# Patient Record
Sex: Female | Born: 1955 | Race: White | Hispanic: No | Smoking: Never smoker
Health system: Southern US, Community
[De-identification: ages and names within clinical notes are randomized; demographics above are authoritative.]

## PROBLEM LIST (undated history)

## (undated) HISTORY — PX: APPENDECTOMY: SHX54

---

## 2005-09-02 ENCOUNTER — Other Ambulatory Visit: Admission: RE | Admit: 2005-09-02 | Discharge: 2005-09-02 | Payer: Self-pay | Admitting: Obstetrics and Gynecology

## 2006-02-28 ENCOUNTER — Ambulatory Visit (HOSPITAL_COMMUNITY): Admission: RE | Admit: 2006-02-28 | Discharge: 2006-02-28 | Payer: Self-pay | Admitting: Obstetrics and Gynecology

## 2007-09-14 ENCOUNTER — Ambulatory Visit (HOSPITAL_COMMUNITY): Admission: RE | Admit: 2007-09-14 | Discharge: 2007-09-14 | Payer: Self-pay | Admitting: Obstetrics and Gynecology

## 2009-09-13 ENCOUNTER — Ambulatory Visit: Payer: Self-pay | Admitting: Sports Medicine

## 2009-09-13 DIAGNOSIS — M79609 Pain in unspecified limb: Secondary | ICD-10-CM | POA: Insufficient documentation

## 2009-10-18 ENCOUNTER — Ambulatory Visit: Payer: Self-pay | Admitting: Sports Medicine

## 2009-10-18 DIAGNOSIS — R269 Unspecified abnormalities of gait and mobility: Secondary | ICD-10-CM | POA: Insufficient documentation

## 2009-10-18 DIAGNOSIS — M722 Plantar fascial fibromatosis: Secondary | ICD-10-CM | POA: Insufficient documentation

## 2010-03-16 ENCOUNTER — Other Ambulatory Visit: Admission: RE | Admit: 2010-03-16 | Discharge: 2010-03-16 | Payer: Self-pay | Admitting: Obstetrics and Gynecology

## 2010-03-16 ENCOUNTER — Ambulatory Visit (HOSPITAL_COMMUNITY): Admission: RE | Admit: 2010-03-16 | Discharge: 2010-03-16 | Payer: Self-pay | Admitting: Internal Medicine

## 2010-04-06 ENCOUNTER — Ambulatory Visit (HOSPITAL_COMMUNITY): Admission: RE | Admit: 2010-04-06 | Discharge: 2010-04-06 | Payer: Self-pay | Admitting: Obstetrics and Gynecology

## 2010-10-09 ENCOUNTER — Ambulatory Visit: Payer: Self-pay | Admitting: Sports Medicine

## 2010-10-09 DIAGNOSIS — S92309A Fracture of unspecified metatarsal bone(s), unspecified foot, initial encounter for closed fracture: Secondary | ICD-10-CM | POA: Insufficient documentation

## 2010-11-06 ENCOUNTER — Ambulatory Visit: Payer: Self-pay | Admitting: Sports Medicine

## 2010-11-06 DIAGNOSIS — M7750 Other enthesopathy of unspecified foot: Secondary | ICD-10-CM | POA: Insufficient documentation

## 2011-01-06 ENCOUNTER — Encounter: Payer: Self-pay | Admitting: Obstetrics and Gynecology

## 2011-01-14 ENCOUNTER — Other Ambulatory Visit: Payer: Self-pay | Admitting: Gastroenterology
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
## 2011-02-05 ENCOUNTER — Ambulatory Visit (INDEPENDENT_AMBULATORY_CARE_PROVIDER_SITE_OTHER): Payer: BC Managed Care – PPO | Admitting: Sports Medicine

## 2011-02-05 ENCOUNTER — Encounter: Payer: Self-pay | Admitting: Sports Medicine

## 2011-02-05 DIAGNOSIS — M2020 Hallux rigidus, unspecified foot: Secondary | ICD-10-CM | POA: Insufficient documentation

## 2011-02-05 DIAGNOSIS — S92309A Fracture of unspecified metatarsal bone(s), unspecified foot, initial encounter for closed fracture: Secondary | ICD-10-CM

## 2011-02-05 DIAGNOSIS — M7750 Other enthesopathy of unspecified foot: Secondary | ICD-10-CM

## 2011-02-05 DIAGNOSIS — M79609 Pain in unspecified limb: Secondary | ICD-10-CM
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
## 2011-02-21 ENCOUNTER — Other Ambulatory Visit: Payer: Self-pay | Admitting: Gastroenterology

## 2011-02-21 DIAGNOSIS — K529 Noninfective gastroenteritis and colitis, unspecified: Secondary | ICD-10-CM

## 2012-03-02 ENCOUNTER — Ambulatory Visit (INDEPENDENT_AMBULATORY_CARE_PROVIDER_SITE_OTHER): Payer: BC Managed Care – PPO | Admitting: Sports Medicine

## 2012-03-02 VITALS — BP 108/60

## 2012-03-02 DIAGNOSIS — M2020 Hallux rigidus, unspecified foot: Secondary | ICD-10-CM

## 2012-03-02 DIAGNOSIS — M25519 Pain in unspecified shoulder: Secondary | ICD-10-CM

## 2012-03-02 DIAGNOSIS — M7750 Other enthesopathy of unspecified foot: Secondary | ICD-10-CM

## 2012-03-02 DIAGNOSIS — M722 Plantar fascial fibromatosis: Secondary | ICD-10-CM

## 2012-03-02 DIAGNOSIS — M25511 Pain in right shoulder: Secondary | ICD-10-CM

## 2012-03-02 MED ORDER — TRAMADOL HCL 50 MG PO TABS: 50.0000 mg | ORAL_TABLET | Freq: Four times a day (QID) | ORAL | Status: DC | PRN

## 2012-03-02 NOTE — Assessment & Plan Note (Signed)
I think the injury to the left great toe with a small avulsion fracture triggered some DJD There is a pseudo-bursa over medial 1st MTP joint left that is very tender  Continue to try to use MT support and good insole support to take pressure off feet  I encouraged using tramadol as she has had some GI bleeding with NSAIDs in past

## 2012-03-02 NOTE — Assessment & Plan Note (Signed)
This is resolved with orthotics and padding

## 2012-03-02 NOTE — Progress Notes (Signed)
  Subjective:    Patient ID: Wendy Morse, female    DOB: 1956-03-01, 56 y.o.   MRN: [id number]  HPI  Pt presents to clinic for f/u lt foot pain which is still bothering her. She is using custom orthotics in MBT shoes. She has added additional padding to heel. She has significant forefoot pain.   Rt shoulder pain x 1 month.  Pain is anterior and posterior. She does regular weight work. Had been using arms when on elliptical.  Stopped elliptcial a month ago because she thought this might have been causing shoulder pain. No specific injury. She did feel weight work hurt and so stopped this as well      Review of Systems     Objective:   Physical Exam NAD  Lt foot exam: Great toe- significant hallus rigidus 30 deg extension, 15 deg flexion- lt great toe 4th MT callus- fat pad minimal  2nd MT slightly dropped  Rt foot: Great toe 40 deg flexion, 50 deg extension great toe Loss of transverse arch, but some padding under all MT heads  Rt shoulder; ER and IR strength excellent Speed's and yergason's neg abduction at 20 degrees Feel some discomfort at 30 and 90 deg, but not weak Neer's positive Empty can positive  Hawkins positive  Push off negative  Neck ROM is good  [place] RT foot shows pseud-obursal change over medial joint line of MTP Small avulsion fracture never fully fused No increase in doppler flow Some increase fluid in MTP joint and over dorsum  Shoulder [place] BT intact - distally there is some slight subluxation near pec tendon with position of tendon shifted medially Supraspinatus, infraspinatus, subscapularis, teres minor - all tendons in tact Slt increase in bursal fluid AC joint OK No bony impingement      Assessment & Plan:

## 2012-03-02 NOTE — Patient Instructions (Addendum)
Please do suggested shoulder exercises daily, start with 1 lb weight and increase to 3lb weight when 1lb is easy  Take tramadol for foot or shoulder pain as needed  Please see [place] in 4-6 weeks if you have not improved  You may want to try [facility] shoes with added forefoot cushion  Thank you for seeing [place] today!

## 2012-03-02 NOTE — Assessment & Plan Note (Signed)
Given a series of rehab exercises to focus on RC but also on the scapular area Do these consistetnly We should reck this in 6 weeks or so  If it worsens would try NTG patches

## 2012-03-02 NOTE — Assessment & Plan Note (Signed)
Added more MT padding to other shoes  She simply has limited fat pad She needs to cont with good support in all shoes

## 2012-03-12 ENCOUNTER — Telehealth: Payer: Self-pay | Admitting: *Deleted

## 2012-03-12 NOTE — Telephone Encounter (Signed)
Message copied by Mora Bellman on Thu Mar 12, 2012  2:27 PM ------      Message from: CERESI, MELANIE L      Created: Thu Mar 12, 2012 11:21 AM      Regarding: pt referral       Pt would like to be referred to a physical therapist for her neck pain.  She states its urgent and wants an appt today, I let her know I don't think that would happen as we don't have any control of PT appts.

## 2012-03-12 NOTE — Telephone Encounter (Signed)
Called pt- left VM to return my call re: which PT office she would like to be referred to.

## 2012-10-01 ENCOUNTER — Other Ambulatory Visit: Payer: Self-pay | Admitting: Nurse Practitioner

## 2012-10-01 ENCOUNTER — Other Ambulatory Visit (HOSPITAL_COMMUNITY)
Admission: RE | Admit: 2012-10-01 | Discharge: 2012-10-01 | Disposition: A | Discharge status: Home / Self Care | Payer: BC Managed Care – PPO | Source: Ambulatory Visit | Attending: Obstetrics and Gynecology | Admitting: Obstetrics and Gynecology

## 2012-10-01 DIAGNOSIS — N760 Acute vaginitis: Secondary | ICD-10-CM | POA: Insufficient documentation

## 2012-10-01 DIAGNOSIS — Z01419 Encounter for gynecological examination (general) (routine) without abnormal findings: Secondary | ICD-10-CM | POA: Insufficient documentation

## 2012-10-02 ENCOUNTER — Other Ambulatory Visit (HOSPITAL_COMMUNITY): Payer: Self-pay | Admitting: Obstetrics and Gynecology

## 2012-10-02 ENCOUNTER — Ambulatory Visit (INDEPENDENT_AMBULATORY_CARE_PROVIDER_SITE_OTHER): Payer: BC Managed Care – PPO | Admitting: Sports Medicine

## 2012-10-02 VITALS — BP 90/60 | Ht 66.0 in | Wt 129.0 lb

## 2012-10-02 DIAGNOSIS — S62523A Displaced fracture of distal phalanx of unspecified thumb, initial encounter for closed fracture: Secondary | ICD-10-CM

## 2012-10-02 DIAGNOSIS — S62639A Displaced fracture of distal phalanx of unspecified finger, initial encounter for closed fracture: Secondary | ICD-10-CM

## 2012-10-02 DIAGNOSIS — M67929 Unspecified disorder of synovium and tendon, unspecified upper arm: Secondary | ICD-10-CM

## 2012-10-02 DIAGNOSIS — M7520 Bicipital tendinitis, unspecified shoulder: Secondary | ICD-10-CM

## 2012-10-02 DIAGNOSIS — M2020 Hallux rigidus, unspecified foot: Secondary | ICD-10-CM

## 2012-10-02 DIAGNOSIS — Z1231 Encounter for screening mammogram for malignant neoplasm of breast: Secondary | ICD-10-CM

## 2012-10-02 MED ORDER — TRAMADOL HCL 50 MG PO TABS: 50.0000 mg | ORAL_TABLET | Freq: Four times a day (QID) | ORAL | Status: DC | PRN

## 2012-10-02 MED ORDER — DICLOFENAC SODIUM 1 % TD GEL: 2.0000 g | Freq: Four times a day (QID) | TRANSDERMAL | Status: AC
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
## 2012-10-20 ENCOUNTER — Ambulatory Visit (HOSPITAL_COMMUNITY)
Admission: RE | Admit: 2012-10-20 | Discharge: 2012-10-20 | Disposition: A | Discharge status: Home / Self Care | Payer: BC Managed Care – PPO | Source: Ambulatory Visit | Attending: Obstetrics and Gynecology | Admitting: Obstetrics and Gynecology

## 2012-10-20 DIAGNOSIS — Z1231 Encounter for screening mammogram for malignant neoplasm of breast: Secondary | ICD-10-CM

## 2013-07-28 ENCOUNTER — Ambulatory Visit (INDEPENDENT_AMBULATORY_CARE_PROVIDER_SITE_OTHER): Payer: BC Managed Care – PPO | Admitting: Sports Medicine

## 2013-07-28 VITALS — BP 100/66 | Ht 66.0 in | Wt 130.0 lb

## 2013-07-28 DIAGNOSIS — M533 Sacrococcygeal disorders, not elsewhere classified: Secondary | ICD-10-CM

## 2013-07-28 DIAGNOSIS — S92309A Fracture of unspecified metatarsal bone(s), unspecified foot, initial encounter for closed fracture: Secondary | ICD-10-CM

## 2013-07-28 MED ORDER — TRAMADOL HCL 50 MG PO TABS
50.0000 mg | ORAL_TABLET | Freq: Four times a day (QID) | ORAL | Status: DC | PRN
Start: 2013-07-28 — End: 2013-08-06
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
## 2013-08-06 ENCOUNTER — Other Ambulatory Visit: Payer: Self-pay | Admitting: *Deleted

## 2013-08-06 MED ORDER — TRAMADOL HCL 50 MG PO TABS: 50.0000 mg | ORAL_TABLET | Freq: Four times a day (QID) | ORAL | Status: DC | PRN

## 2013-08-17 ENCOUNTER — Other Ambulatory Visit: Payer: Self-pay | Admitting: *Deleted

## 2013-08-17 ENCOUNTER — Telehealth: Payer: Self-pay | Admitting: *Deleted

## 2013-08-17 MED ORDER — METHOCARBAMOL 750 MG PO TABS: 750.0000 mg | ORAL_TABLET | Freq: Two times a day (BID) | ORAL | Status: DC | PRN

## 2013-08-17 MED ORDER — CYCLOBENZAPRINE HCL 10 MG PO TABS: ORAL_TABLET | ORAL | Status: DC

## 2013-08-17 NOTE — Telephone Encounter (Addendum)
Message copied by Mora Bellman on Tue Aug 17, 2013  2:40 PM ------      Message from: Lizbeth Bark      Created: Tue Aug 17, 2013  1:32 PM      Regarding: phone message      Contact: [phone]       Pt called asking if she can get a muscle relaxer for her back pain.      Pharmacy is [facility] [phone] ------

## 2013-08-17 NOTE — Telephone Encounter (Signed)
Per pt she did not want flexeril - makes her too drowsy.  Called in robaxin per Dr. Margaretha Sheffield. Pt notified.

## 2013-08-24 ENCOUNTER — Telehealth: Payer: Self-pay | Admitting: Sports Medicine

## 2013-08-24 NOTE — Telephone Encounter (Signed)
Message copied by Ralene Cork on Tue Aug 24, 2013  4:47 PM ------      Message from: Lizbeth Bark      Created: Tue Aug 24, 2013  3:03 PM      Regarding: PHONE MESSAGE      Contact: [phone]       Pt would like a call from regarding a f/u conversation. ------

## 2013-08-24 NOTE — Telephone Encounter (Signed)
I spoke with the patient on the phone today. She continues to experience low back pain and is now getting radiating pain down the leg. She is also getting some associated numbness and tingling. She denies any weakness. She has been diligent about doing her home exercises. At this point I think it is prudent to proceed with an MRI scan of her lumbar spine to rule out any possible disc pathology that may be responsible for her symptoms. We will set up the MRI to be done sometime early next week and I will followup with her via telephone with those results. In the meantime I've asked that she start taking Aleve 1-2 pills twice daily. She is cautioned about GI upset. She can take these safely with her muscle relaxer. I will likely want the patient to return to the office at some point after the MRI so that I may correlate her symptoms with any suspicious findings on the MRI. I also explained to the patient that we can use a short course of prednisone if her symptoms warrant.

## 2013-08-25 ENCOUNTER — Telehealth: Payer: Self-pay | Admitting: *Deleted

## 2013-08-25 DIAGNOSIS — M545 Low back pain, unspecified: Secondary | ICD-10-CM

## 2013-08-25 NOTE — Telephone Encounter (Signed)
Message copied by Mora Bellman on Wed Aug 25, 2013  9:01 AM ------      Message from: Ralene Cork      Created: Tue Aug 24, 2013  4:50 PM      Regarding: FW: PHONE MESSAGE      Contact: [phone]       Spoke with the patient. Note dictated. Please schedule an MRI of her lumbar spine to rule out a lumbar disc herniation. She would like to have this done next Tuesday afternoon if possible.            ----- Message -----         From: Lizbeth Bark         Sent: 08/24/2013   3:03 PM           To: Ralene Cork, DO      Subject: PHONE MESSAGE                                            Pt would like a call from regarding a f/u conversation.       ------

## 2013-08-25 NOTE — Telephone Encounter (Signed)
Scheduled pt for MRI at [facility] [address] on 08/31/13 @ 6:30 pm.  Pt notified of appt info.

## 2013-08-31 ENCOUNTER — Ambulatory Visit
Admission: RE | Admit: 2013-08-31 | Discharge: 2013-08-31 | Disposition: A | Discharge status: Home / Self Care | Payer: BC Managed Care – PPO | Source: Ambulatory Visit | Attending: Sports Medicine | Admitting: Sports Medicine

## 2013-08-31 DIAGNOSIS — M545 Low back pain, unspecified: Secondary | ICD-10-CM

## 2013-09-03 ENCOUNTER — Telehealth: Payer: Self-pay | Admitting: Sports Medicine

## 2013-09-03 NOTE — Telephone Encounter (Signed)
I spoke with the patient on the phone today regarding MRI findings of her lumbar spine. Dominant finding is facet arthropathy bilaterally at L4-L5. She also has a small bulging disc at L5-S1. Over the past week her pain has improved. She is avoiding both biking and rowing and as a consequence her pain is improving. My device at this point in time is for her to continue to avoid those activities for another 2-3 weeks. I would like for her to catch up with Jeanene Erb for some physical therapy. Since her symptoms are improving I am going to hold on any further treatment. I explained to her that we can get more aggressive in the form of facet or epidural steroid injections if her symptoms warrant. She will followup when necessary.

## 2013-09-29 ENCOUNTER — Ambulatory Visit: Payer: BC Managed Care – PPO | Admitting: Emergency Medicine

## 2013-12-03 ENCOUNTER — Ambulatory Visit
Admission: RE | Admit: 2013-12-03 | Discharge: 2013-12-03 | Disposition: A | Discharge status: Home / Self Care | Payer: BC Managed Care – PPO | Source: Ambulatory Visit | Attending: Internal Medicine | Admitting: Internal Medicine

## 2013-12-03 ENCOUNTER — Other Ambulatory Visit: Payer: Self-pay | Admitting: Internal Medicine

## 2013-12-03 DIAGNOSIS — R05 Cough: Secondary | ICD-10-CM

## 2013-12-03 DIAGNOSIS — R059 Cough, unspecified: Secondary | ICD-10-CM

## 2014-05-13 ENCOUNTER — Ambulatory Visit (INDEPENDENT_AMBULATORY_CARE_PROVIDER_SITE_OTHER): Payer: BC Managed Care – PPO | Admitting: Sports Medicine

## 2014-05-13 ENCOUNTER — Encounter: Payer: Self-pay | Admitting: Sports Medicine

## 2014-05-13 VITALS — BP 96/62 | Ht 66.0 in | Wt 128.0 lb

## 2014-05-13 DIAGNOSIS — M545 Low back pain, unspecified: Secondary | ICD-10-CM

## 2014-05-13 MED ORDER — METHOCARBAMOL 750 MG PO TABS: 750.0000 mg | ORAL_TABLET | Freq: Two times a day (BID) | ORAL | Status: DC | PRN

## 2014-05-13 MED ORDER — TRAMADOL HCL 50 MG PO TABS: 50.0000 mg | ORAL_TABLET | Freq: Four times a day (QID) | ORAL | Status: DC | PRN

## 2014-05-13 NOTE — Progress Notes (Signed)
   Subjective:    Patient ID: Wendy Morse, female    DOB: January 10, 1956, 58 y.o.   MRN: [id number]  HPI chief complaint: Low back pain and left leg pain  Patient comes in today complaining of 2-4 weeks of returning left-sided low back pain. She has a history of low back pain which in the past has been diagnosed as SI joint dysfunction. She still has intermittent left-sided low back pain but over the past couple of weeks has developed more in the way of cramping and numbness down the left leg. Discomfort will travel into the left ankle. Tends to be worse with prolonged sitting such as driving. She's also getting some left groin pain. No trauma. She's not noticed any weakness. She does get symptom relief with taking tramadol along with a combination ibuprofen/codeine medication which she got in [place]. An MRI of her lumbar spine done in September of 2014 showed prominent facet arthropathy at L4-L5 along with some mild degenerative disc disease at L5-S1 but there was no significant spinal or foraminal stenosis seen (only very mild spinal stenosis at L4-L5). She has also noticed that she tends to wear the left heel of her shoes out much more than the right heel.  Interim medical history reviewed Current medications reviewed Allergies reviewed    Review of Systems     Objective:   Physical Exam Well-developed, well-nourished. No acute distress. Awake alert and oriented x3. Vital signs reviewed.  Lumbar spine: Full lumbar range of motion. No tenderness to palpation over the lumbar midline. No tenderness at the SI joint. No muscle spasm. Negative straight leg raise bilaterally. Negative log roll bilaterally. Neurological exam does show 4/5 strength with resisted plantarflexion on the left compared to the right. However, she is able to ambulate on her tiptoes without any noticeable weakness. Remainder of her strength is 5/5 in both lower extremities. No atrophy. Reflexes are trace but equal at the  Achilles and patellar tendons bilaterally. No noticeable leg length discrepancy. Normal gait.  MRI is as above       Assessment & Plan:  Low back pain and left leg radiculopathy likely secondary to mild L5-S1 degenerative disc disease  Although the MRI done in 2014 does not show any definitive foraminal stenosis, her history and physical are suggestive of lumbar radiculopathy. I've given her a set of McKenzie exercises and asked her to avoid any type of repetitive lumbar flexion. I've refilled both her Robaxin and her tramadol to take as needed. She can increase activity as tolerated. If symptoms worsen, particularly in regards to weakness, I've asked the patient to notify me at which point we may have to consider repeat imaging. Otherwise, followup when necessary.

## 2014-07-01 ENCOUNTER — Encounter: Payer: Self-pay | Admitting: Sports Medicine

## 2014-07-01 ENCOUNTER — Ambulatory Visit
Admission: RE | Admit: 2014-07-01 | Discharge: 2014-07-01 | Disposition: A | Discharge status: Home / Self Care | Payer: BC Managed Care – PPO | Source: Ambulatory Visit | Attending: Sports Medicine | Admitting: Sports Medicine

## 2014-07-01 ENCOUNTER — Ambulatory Visit (INDEPENDENT_AMBULATORY_CARE_PROVIDER_SITE_OTHER): Payer: BC Managed Care – PPO | Admitting: Sports Medicine

## 2014-07-01 VITALS — BP 114/73 | HR 56 | Ht 66.0 in | Wt 127.0 lb

## 2014-07-01 DIAGNOSIS — M25532 Pain in left wrist: Secondary | ICD-10-CM

## 2014-07-01 DIAGNOSIS — R269 Unspecified abnormalities of gait and mobility: Secondary | ICD-10-CM

## 2014-07-01 DIAGNOSIS — M25539 Pain in unspecified wrist: Secondary | ICD-10-CM

## 2014-07-01 NOTE — Progress Notes (Signed)
   Subjective:    Patient ID: Wendy Morse, female    DOB: 01/28/1956, 58 y.o.   MRN: [id number]  HPI Patient comes in at my request to be evaluated by both myself and Dr. Darrick PennaFields. At her last office visit she showed me shoes with an unusual wear pattern. There is wear along the lateral aspect of her shoes consistent with supination but also an excessive amount of wear in the left heel. I asked her to return today so that Dr.Fields could also evaluate her. She has a history of lumbar arthropathy with left leg radiculopathy (please see last office note for details regarding this). She is also complaining of some pain in the volar aspect of her left wrist. She fell about 10 weeks ago and has had persistent pain just distal to the scaphoid tubercle. She has a previous injury to this left wrist which was concerning for a possible scaphoid fracture. She recovered from that injury without any complication. She is concerned that she may have reinjured her scaphoid. She has not noticed any limited range of motion. Only pain with direct pressure over the area.   Review of Systems     Objective:   Physical Exam Well-developed, well-nourished. No acute distress. Awake alert and oriented x3. Vital signs reviewed  Left wrist: Full range of motion. No effusion. No soft tissue swelling. There is tenderness to palpation along the volar aspect of the wrist just distal to the scaphoid tubercle. No tenderness in the anatomic snuff box. No tenderness over the distal radius. Neurovascularly intact distally.  Examination of her feet in the standing position shows collapse of the lateral column breakdown bilaterally, right greater than left. She has accompanying claw toe deformity of the second through fourth toes. Callus buildup along the metatarsal heads with collapse of the transverse arch. She does not supinate with walking. In fact, there is a very mild amount of pronation present.  X-rays of the left wrist  including AP, lateral, scaphoid, and carpal tunnel views are unremarkable. No obvious fracture .       Assessment & Plan:  Lateral column breakdown, bilateral feet Left wrist contusion  Reassurance regarding her left wrist. We gave her some green sports insoles with metatarsal pads and a lateral post. She has had custom orthotics made in the past and she will drop them off at our office at her convenience so that we may can post the laterally also. Followup formally when necessary.

## 2014-09-27 ENCOUNTER — Encounter: Payer: Self-pay | Admitting: Sports Medicine

## 2014-09-27 ENCOUNTER — Ambulatory Visit (INDEPENDENT_AMBULATORY_CARE_PROVIDER_SITE_OTHER): Payer: BC Managed Care – PPO | Admitting: Sports Medicine

## 2014-09-27 VITALS — BP 122/76 | HR 56 | Ht 66.0 in | Wt 127.0 lb

## 2014-09-27 DIAGNOSIS — M25562 Pain in left knee: Secondary | ICD-10-CM | POA: Diagnosis not present

## 2014-09-27 MED ORDER — TRAMADOL HCL 50 MG PO TABS: 50.0000 mg | ORAL_TABLET | Freq: Four times a day (QID) | ORAL | Status: DC | PRN

## 2014-09-27 NOTE — Progress Notes (Signed)
   Subjective:    Patient ID: Wendy Morse, female    DOB: 12/08/1956, 58 y.o.   MRN: [id number]  HPI Patient comes in today with worsening left leg pain and weakness. She has a history of sciatica. She complains of a diffuse pain in the posterior left hip with radiating discomfort down the left leg past the knee and into the foot. She gets some associated numbness and tingling as well. Also some weakness. She denies any groin pain. An MRI of her lumbar spine done in September of 2014 showed some degenerative changes at the L4-L5 facets and a shallow disc protrusion at L5-S1. Otherwise unremarkable. She has been compliant with her home exercises including McKenzie exercises.    Review of Systems     Objective:   Physical Exam Well-developed, fit-appearing. No acute distress  Negative straight leg raise bilaterally. She is tender to palpation of the left greater trochanteric bursa but not markedly. She has significant hip abductor weakness bilaterally. Negative log roll bilaterally. Mild amount of weakness with resisted plantarflexion on the left compared to the right. Otherwise strength is 5/5 both lower extremities. Reflexes are trace but equal at the Achilles and patellar tendons. Sensation is intact to light touch grossly. No atrophy.       Assessment & Plan:  Left leg pain likely secondary to lumbar radiculopathy Left hip pain secondary to mild greater trochanteric bursitis  Given her worsening symptoms I recommend an EMG/nerve conduction study. I will call her with those results once available. I have refilled her tramadol. She's instructed in hip abductor strengthening exercises. Of note, she brought in her other orthotics today for us to fit him with lateral posts. We had fitted a previous pair of green sports insoles with the same posts.

## 2014-09-27 NOTE — Patient Instructions (Signed)
Dr Marcy SirenIbazebo's office will be giving you a call with your appt for the nerve conduction study: [phone]

## 2014-10-26 ENCOUNTER — Telehealth: Payer: Self-pay | Admitting: Sports Medicine

## 2014-10-26 ENCOUNTER — Other Ambulatory Visit: Payer: Self-pay | Admitting: *Deleted

## 2014-10-26 DIAGNOSIS — M5442 Lumbago with sciatica, left side: Secondary | ICD-10-CM

## 2014-10-26 NOTE — Telephone Encounter (Signed)
I spoke with patient on the phone yesterday after reviewing a recent EMG/nerve conduction study of her left lower extremity. That study shows evidence of chronic lumbar radiculopathy affecting the left L4 or L5 or S1 levels. It was recommended by the physiatrist performing the test that an updated MRI of her lumbar spine be performed to correlate these findings with any advancing lumbar degenerative disc disease. It was also recommended that an MRI of her pelvis be done as the patient was complaining of some swelling in the area of her SI joint. Therefore, I will go ahead and order an MRI of her lumbar spine and pelvis and will follow-up with her on the phone once I reviewed those studies.

## 2014-10-27 ENCOUNTER — Encounter: Payer: Self-pay | Admitting: Sports Medicine

## 2014-11-05 ENCOUNTER — Other Ambulatory Visit: Payer: BC Managed Care – PPO

## 2014-11-06 ENCOUNTER — Ambulatory Visit
Admission: RE | Admit: 2014-11-06 | Discharge: 2014-11-06 | Disposition: A | Discharge status: Home / Self Care | Payer: BC Managed Care – PPO | Source: Ambulatory Visit | Attending: Sports Medicine | Admitting: Sports Medicine

## 2014-11-06 DIAGNOSIS — M5442 Lumbago with sciatica, left side: Secondary | ICD-10-CM

## 2014-11-17 ENCOUNTER — Telehealth: Payer: Self-pay | Admitting: Sports Medicine

## 2014-11-17 NOTE — Telephone Encounter (Signed)
I spoke with Wendy Morse on the phone today to discuss MRI findings of her lumbar spine and her pelvis. 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 The hamstring origin is along the anterior medial border of the ganglion cyst. There is an incidental finding of edema in the quadratus femoris muscles bilaterally suggesting bilateral ischiofemoral impingement. MRI of her lumbar spine shows some multilevel degenerative changes but no evidence of significant canal or foraminal stenosis.  I've discussed this case with Dr.Fields who believes that it is reasonable to bring Wendy Morse in to the office for an ultrasound evaluation of the cyst and potential aspiration and injection. I did explain to Wendy Morse that the cysts are sometimes very gelatinous and extremely difficult to aspirate and that if aspiration was unsuccessful then surgical referral may be necessary. She understands. She will check her schedule and schedule an appointment with Dr Darrick PennaFields at her convenience.

## 2015-03-28 ENCOUNTER — Ambulatory Visit
Admission: RE | Admit: 2015-03-28 | Discharge: 2015-03-28 | Disposition: A | Discharge status: Home / Self Care | Payer: BC Managed Care – PPO | Source: Ambulatory Visit | Attending: Internal Medicine | Admitting: Internal Medicine

## 2015-03-28 ENCOUNTER — Other Ambulatory Visit: Payer: Self-pay | Admitting: Internal Medicine

## 2015-03-28 DIAGNOSIS — R519 Headache, unspecified: Secondary | ICD-10-CM

## 2015-03-28 DIAGNOSIS — R51 Headache: Principal | ICD-10-CM

## 2015-04-06 ENCOUNTER — Ambulatory Visit (INDEPENDENT_AMBULATORY_CARE_PROVIDER_SITE_OTHER): Payer: BC Managed Care – PPO | Admitting: Sports Medicine

## 2015-04-06 ENCOUNTER — Encounter: Payer: Self-pay | Admitting: Sports Medicine

## 2015-04-06 VITALS — BP 111/45 | Ht 66.0 in | Wt 130.0 lb

## 2015-04-06 DIAGNOSIS — M5432 Sciatica, left side: Secondary | ICD-10-CM | POA: Insufficient documentation

## 2015-04-06 MED ORDER — GABAPENTIN 300 MG PO CAPS: 300.0000 mg | ORAL_CAPSULE | Freq: Every day | ORAL | Status: DC

## 2015-04-06 MED ORDER — TRAMADOL HCL 50 MG PO TABS
50.0000 mg | ORAL_TABLET | Freq: Four times a day (QID) | ORAL | Status: DC | PRN
Start: 2015-04-06 — End: 2015-04-06

## 2015-04-06 MED ORDER — METHYLPREDNISOLONE ACETATE 40 MG/ML IJ SUSP
40.0000 mg | Freq: Once | INTRAMUSCULAR | Status: AC
  Administered 2015-04-06: 40 mg via INTRA_ARTICULAR

## 2015-04-06 MED ORDER — TRAMADOL HCL 50 MG PO TABS: 50.0000 mg | ORAL_TABLET | Freq: Four times a day (QID) | ORAL | Status: DC | PRN

## 2015-04-06 NOTE — Assessment & Plan Note (Signed)
US evaluation There is no hip muscle tear noted in hip rotators HS tendons intact to attachment at Ish Tub. There is a hypoechoic bursal type swelling ~ 3 cms in length between most prox HS insertion and obturator internus This is not circumscribed and does not look like a true ganglion Sciatic nerve is lateral to this with margin of swelling on medial edge of nerve  Procedure note Procedure:  Injection of Ischial bursitis left Consent obtained and verified. Time-out conducted. Noted no overlying erythema, induration, or other signs of local infection;  Skin prepped in a sterile fashion. Topical analgesic spray: Ethyl chloride. Completed without difficulty with US to infiltrate bursal swelling and avoid sciatic N/  Just medial and superior to isch Tub on short axis injection with probe long axis to tendon insertion of HS Meds: 1 cc solumedrol 40 and 4 cc lidocaine 1% Pain immediately improved suggesting accurate placement of the medication./ tingling into leg during procedure Advised to call if fevers/chills, erythema, induration, drainage, or persistent bleeding.  I would like to recheck in 6 weeks  Trial of Gabapentin for sciatic pain not relieved by injection HEP

## 2015-04-06 NOTE — Progress Notes (Signed)
Patient ID: Wendy Morse, female   DOB: [birth year], 59 y.o.   MRN: [id number]  Patient has a history of persistent sciatica of left side 6 mos ago we did an MRI that demonstrated a ganglion cyst of 3 cms length with loculations This appeared under obturator internus Hx of some HS pain  Likely trigger may have been rowing machine Can bike now but roweing machine makes this worse even with padding  Now gets radiating pain into thigha nd some times feels numb down to foot  Exam NAD BP 111/45 mmHg  Ht 5\' 6"  (1.676 m)  Wt 130 lb (58.968 kg)  BMI 20.99 kg/m2  TTP directly over lieft lower buttocks This causes + tinel's HS non tender and is strong to testing SLR causes tingling into leg and calf Neural stretch tests +  Left hip - full ROM and norm strength

## 2015-04-06 NOTE — Patient Instructions (Signed)
Use tramadol 1 as needed for pain OK to take even if you have taken gabapentin  Gabapentin use only at night and only if the sciatica is bad that day Side effects - primarily drowsy or dizzy/ but a few people get mood swings \\if  so stop  Keep up the stretches and back bridge  Add 2 regular exercises Hip rotations and hamstring curls and swings  Roughly 3 sets of 15 and do them as often as reasonable  Wait 3 days before any real activity

## 2015-04-27 ENCOUNTER — Ambulatory Visit: Payer: BC Managed Care – PPO

## 2015-05-08 ENCOUNTER — Ambulatory Visit (INDEPENDENT_AMBULATORY_CARE_PROVIDER_SITE_OTHER): Payer: BC Managed Care – PPO

## 2015-05-08 DIAGNOSIS — R002 Palpitations: Secondary | ICD-10-CM

## 2015-05-08 DIAGNOSIS — R55 Syncope and collapse: Secondary | ICD-10-CM

## 2015-05-18 ENCOUNTER — Other Ambulatory Visit: Payer: Self-pay | Admitting: Sports Medicine

## 2015-06-29 ENCOUNTER — Other Ambulatory Visit: Payer: Self-pay | Admitting: *Deleted

## 2015-06-29 MED ORDER — TRAMADOL HCL 50 MG PO TABS: ORAL_TABLET | ORAL | Status: DC

## 2015-07-26 ENCOUNTER — Encounter: Payer: Self-pay | Admitting: Sports Medicine

## 2015-07-26 ENCOUNTER — Ambulatory Visit (INDEPENDENT_AMBULATORY_CARE_PROVIDER_SITE_OTHER): Payer: BC Managed Care – PPO | Admitting: Sports Medicine

## 2015-07-26 VITALS — BP 106/60 | Ht 66.0 in | Wt 130.0 lb

## 2015-07-26 DIAGNOSIS — M25562 Pain in left knee: Secondary | ICD-10-CM | POA: Diagnosis not present

## 2015-07-26 DIAGNOSIS — R269 Unspecified abnormalities of gait and mobility: Secondary | ICD-10-CM | POA: Diagnosis not present

## 2015-07-27 NOTE — Progress Notes (Signed)
   Subjective:    Patient ID: Wendy Morse, female    DOB: [birth year], 59 y.o.   MRN: [id number]  HPI chief complaint: Left leg and hip pain  Patient comes in today with returning posterior left hip pain. She has a documented history of a ganglion cyst just posterior to the left obturator internus,gemellus, and quadratis femoris with impingement upon the left sciatic nerve. She was last seen in the office back in April at which time Dr. Darrick Penna injected this area with a cortisone injection under ultrasound. Patient did have a positive response to the injection but it was short-lived. Her symptoms are beginning to return but are not as bad as they were previously. Her main complaint is a "ice pick stabbing pain" in the left posterior hip. She has given up rowing and has tried to do more paddle boarding but this also causes her discomfort. Pain at times were radiate into her groin and down the right leg. Intermittent numbness and tingling but nothing long-standing. She has been given some home exercises in the past and states that she has been "somewhat compliant" with these. However, she still feels as though the left leg is weak. No recent trauma.  She is also beginning to notice some lateral right foot pain. She has noticed a drop in her transverse arch. She has custom orthotics which we've created in the past. We have added lateral fifth ray posts to help correct supination and we have also previously added metatarsal pads. She has not noticed any swelling in her foot. Overall she finds the orthotics to be comfortable.    Review of Systems     Objective:   Physical Exam Well-developed, fit appearing. No acute distress  Left hip: There is tenderness to palpation near the area of the ganglion cyst seen on her MRI. No tenderness over the greater trochanteric bursa. Negative logroll. Smooth hip range of motion. Negative Trendelenburg. She does however have significant hip abductor weakness as  well as quad weakness when compared to her right hip.  Examination of her feet show collapse of the transverse arches bilaterally. No tenderness to palpation. No soft tissue swelling. Her orthotics seem to correct her supination quite nicely.       Assessment & Plan:   Returning left hip pain with MRI evidence of a posterior ganglion cyst with impingement upon the sciatic nerve Transverse arch collapse, bilateral feet  Patient is reeducated in a comprehensive hip strengthening program including hip flexion, abduction, and adduction. She is also educated in a Dance movement psychotherapist program. We have replaced the metatarsal pads on her orthotics and I've asked that she return to the office in 6 weeks for reevaluation. I told her that I will retest her hip and quad strength at that visit. I think she can continue with activity using pain as her guide but I agree with the discontinuation of rowing as this will put pressure over the area of the ganglion cyst and sciatic nerve.

## 2015-08-31 ENCOUNTER — Other Ambulatory Visit: Payer: Self-pay | Admitting: Sports Medicine

## 2015-10-20 ENCOUNTER — Other Ambulatory Visit: Payer: Self-pay | Admitting: *Deleted

## 2015-10-20 MED ORDER — TRAMADOL HCL 50 MG PO TABS: 50.0000 mg | ORAL_TABLET | Freq: Two times a day (BID) | ORAL | Status: DC

## 2015-12-05 ENCOUNTER — Other Ambulatory Visit (HOSPITAL_COMMUNITY)
Admission: RE | Admit: 2015-12-05 | Discharge: 2015-12-05 | Disposition: A | Discharge status: Home / Self Care | Payer: BC Managed Care – PPO | Source: Ambulatory Visit | Attending: Nurse Practitioner | Admitting: Nurse Practitioner

## 2015-12-05 ENCOUNTER — Other Ambulatory Visit: Payer: Self-pay | Admitting: Nurse Practitioner

## 2015-12-05 DIAGNOSIS — Z1151 Encounter for screening for human papillomavirus (HPV): Secondary | ICD-10-CM | POA: Insufficient documentation

## 2015-12-05 DIAGNOSIS — Z01419 Encounter for gynecological examination (general) (routine) without abnormal findings: Secondary | ICD-10-CM | POA: Diagnosis present

## 2015-12-07 LAB — CYTOLOGY - PAP

## 2016-01-17 ENCOUNTER — Other Ambulatory Visit: Payer: Self-pay

## 2016-01-17 DIAGNOSIS — Z1231 Encounter for screening mammogram for malignant neoplasm of breast: Secondary | ICD-10-CM

## 2016-01-29 ENCOUNTER — Ambulatory Visit: Payer: BC Managed Care – PPO

## 2016-02-15 ENCOUNTER — Ambulatory Visit
Admission: RE | Admit: 2016-02-15 | Discharge: 2016-02-15 | Disposition: A | Discharge status: Home / Self Care | Payer: BC Managed Care – PPO | Source: Ambulatory Visit

## 2016-02-15 DIAGNOSIS — Z1231 Encounter for screening mammogram for malignant neoplasm of breast: Secondary | ICD-10-CM

## 2016-04-26 ENCOUNTER — Ambulatory Visit: Payer: BC Managed Care – PPO | Admitting: Family Medicine

## 2016-04-29 ENCOUNTER — Other Ambulatory Visit: Payer: Self-pay | Admitting: *Deleted

## 2016-04-29 MED ORDER — TRAMADOL HCL 50 MG PO TABS: 50.0000 mg | ORAL_TABLET | Freq: Two times a day (BID) | ORAL | Status: DC | PRN

## 2016-11-25 ENCOUNTER — Other Ambulatory Visit: Payer: Self-pay | Admitting: *Deleted

## 2016-11-25 MED ORDER — TRAMADOL HCL 50 MG PO TABS: 50.0000 mg | ORAL_TABLET | Freq: Two times a day (BID) | ORAL | 2 refills | Status: DC | PRN

## 2016-11-29 ENCOUNTER — Ambulatory Visit (INDEPENDENT_AMBULATORY_CARE_PROVIDER_SITE_OTHER): Payer: BC Managed Care – PPO | Admitting: Sports Medicine

## 2016-11-29 VITALS — BP 101/57 | Ht 66.0 in | Wt 126.0 lb

## 2016-11-29 DIAGNOSIS — M25512 Pain in left shoulder: Secondary | ICD-10-CM | POA: Diagnosis not present

## 2016-11-29 DIAGNOSIS — M779 Enthesopathy, unspecified: Secondary | ICD-10-CM | POA: Diagnosis not present

## 2016-11-29 DIAGNOSIS — R269 Unspecified abnormalities of gait and mobility: Secondary | ICD-10-CM

## 2016-11-29 DIAGNOSIS — M722 Plantar fascial fibromatosis: Secondary | ICD-10-CM | POA: Diagnosis not present

## 2016-11-29 MED ORDER — TRAMADOL HCL 50 MG PO TABS: 50.0000 mg | ORAL_TABLET | Freq: Two times a day (BID) | ORAL | 2 refills | Status: DC | PRN

## 2016-11-29 NOTE — Progress Notes (Signed)
   Subjective:    Patient ID: Wendy Morse, female    DOB: 07/12/1956, 60 y.o.   MRN: [id number]  HPI chief complaint: Left shoulder pain  Patient comes in today complaining of one week of left shoulder pain. She does not recall any specific injury but she does describe an acute onset of pain that began late last week. She has been taking some tramadol and ibuprofen as well as some Tylenol and as a result her pain has improved but not resolved. It is a diffuse pain along the lateral shoulder which will radiate at times to the elbow. She initially was unable to raise her arm above shoulder level but that has since improved. She was initially getting some nighttime pain as well but that has also resolved. The only trauma to this left shoulder was 20 years ago when she had a hematoma after running into a rather large rock formation while hiking. No recent injury. No associated numbness or tingling.  She is also complaining of some right anterior knee pain. She localizes it to the proximal tibia. No swelling. No instability.  Interim medical history reviewed Medications reviewed Allergies reviewed    Review of Systems    as above Objective:   Physical Exam  Well-developed, well-nourished. No acute distress  Left shoulder: Full range of motion with a positive painful arc. No tenderness to palpation over the acromioclavicular joint nor over the bicipital groove. Positive empty can, positive Hawkins. Rotator cuff strength is 5/5 bilaterally but reproducible of pain with resisted supraspinatus. She is neurovascular intact distally.  Right knee: Full range of motion. No effusion. She is tender to palpation along the proximal tibia in the anterior knee but no joint line tenderness. Negative McMurray's. Good joint stability. Neurovascular intact distally. She walks without a limp.  [facility] ultrasound of the left shoulder was performed. Limited images were obtained. Biceps tendon was well  visualized and unremarkable in the bicipital groove. Subscapularis and infraspinatus also unremarkable. Supraspinatus shows a small hypoechoic area on the articular side of the tendon which is best seen in [place]. It is not well appreciated and short view. I do not see a significant amount of bursal fluid. Findings are consistent with a possible small articular sided supraspinatus tear.      Assessment & Plan:   Left shoulder pain possibly secondary to small partial thickness supra spinatus tear Right knee pain, likely biomechanical  For her left shoulder I have given her a set of Jobe home exercises and scapular stabilization exercises. I've encouraged her to use topical Voltaren (she has used this in the past with good results). She needs to be careful with ibuprofen as she has a history of an upper GI bleed and had some GI upset several months ago. She also needs to avoid repetitive overhead strengthening exercises. She seems to do well with tramadol so I've given her a refill on this as well. For her knee pain, we will add a lateral, fifth ray post to a couple of pairs of her orthotics. We have done this to a previous pair and she found him to be comfortable. We also gave her several metatarsal pads for her to wear in other shoes. Her shoulder pain persists, we could consider trying either topical nitroglycerin, considering a subacromial cortisone injection, were doing further diagnostic imaging. She will follow-up for ongoing or recalcitrant issues.

## 2017-02-20 ENCOUNTER — Other Ambulatory Visit: Payer: Self-pay | Admitting: Nurse Practitioner

## 2017-02-20 DIAGNOSIS — Z1231 Encounter for screening mammogram for malignant neoplasm of breast: Secondary | ICD-10-CM

## 2017-03-27 ENCOUNTER — Ambulatory Visit: Payer: BC Managed Care – PPO

## 2017-04-04 ENCOUNTER — Ambulatory Visit
Admission: RE | Admit: 2017-04-04 | Discharge: 2017-04-04 | Disposition: A | Discharge status: Home / Self Care | Payer: BC Managed Care – PPO | Source: Ambulatory Visit | Attending: Nurse Practitioner | Admitting: Nurse Practitioner

## 2017-04-04 DIAGNOSIS — Z1231 Encounter for screening mammogram for malignant neoplasm of breast: Secondary | ICD-10-CM

## 2017-04-08 ENCOUNTER — Other Ambulatory Visit: Payer: Self-pay | Admitting: Nurse Practitioner

## 2017-04-08 DIAGNOSIS — R928 Other abnormal and inconclusive findings on diagnostic imaging of breast: Secondary | ICD-10-CM

## 2017-04-15 ENCOUNTER — Ambulatory Visit
Admission: RE | Admit: 2017-04-15 | Discharge: 2017-04-15 | Disposition: A | Discharge status: Home / Self Care | Payer: BC Managed Care – PPO | Source: Ambulatory Visit | Attending: Nurse Practitioner | Admitting: Nurse Practitioner

## 2017-04-15 DIAGNOSIS — R928 Other abnormal and inconclusive findings on diagnostic imaging of breast: Secondary | ICD-10-CM

## 2017-04-17 DIAGNOSIS — R768 Other specified abnormal immunological findings in serum: Secondary | ICD-10-CM | POA: Insufficient documentation

## 2017-04-17 DIAGNOSIS — Z85828 Personal history of other malignant neoplasm of skin: Secondary | ICD-10-CM | POA: Insufficient documentation

## 2017-04-17 DIAGNOSIS — Z8719 Personal history of other diseases of the digestive system: Secondary | ICD-10-CM | POA: Insufficient documentation

## 2017-04-17 DIAGNOSIS — F5101 Primary insomnia: Secondary | ICD-10-CM | POA: Insufficient documentation

## 2017-04-17 DIAGNOSIS — Z8669 Personal history of other diseases of the nervous system and sense organs: Secondary | ICD-10-CM | POA: Insufficient documentation

## 2017-04-17 DIAGNOSIS — Z8659 Personal history of other mental and behavioral disorders: Secondary | ICD-10-CM | POA: Insufficient documentation

## 2017-04-17 DIAGNOSIS — M2550 Pain in unspecified joint: Secondary | ICD-10-CM | POA: Insufficient documentation
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
## 2017-04-18 ENCOUNTER — Ambulatory Visit (INDEPENDENT_AMBULATORY_CARE_PROVIDER_SITE_OTHER): Payer: Self-pay

## 2017-04-18 ENCOUNTER — Ambulatory Visit (INDEPENDENT_AMBULATORY_CARE_PROVIDER_SITE_OTHER): Payer: BC Managed Care – PPO | Admitting: Rheumatology

## 2017-04-18 ENCOUNTER — Encounter: Payer: Self-pay | Admitting: Rheumatology

## 2017-04-18 VITALS — BP 124/64 | HR 58

## 2017-04-18 DIAGNOSIS — M79672 Pain in left foot: Secondary | ICD-10-CM | POA: Diagnosis not present

## 2017-04-18 DIAGNOSIS — Z8659 Personal history of other mental and behavioral disorders: Secondary | ICD-10-CM

## 2017-04-18 DIAGNOSIS — M79671 Pain in right foot: Secondary | ICD-10-CM | POA: Diagnosis not present

## 2017-04-18 DIAGNOSIS — I7300 Raynaud's syndrome without gangrene: Secondary | ICD-10-CM

## 2017-04-18 DIAGNOSIS — M2550 Pain in unspecified joint: Secondary | ICD-10-CM | POA: Diagnosis not present

## 2017-04-18 DIAGNOSIS — R768 Other specified abnormal immunological findings in serum: Secondary | ICD-10-CM

## 2017-04-18 DIAGNOSIS — F5101 Primary insomnia: Secondary | ICD-10-CM

## 2017-04-18 DIAGNOSIS — Z85828 Personal history of other malignant neoplasm of skin: Secondary | ICD-10-CM

## 2017-04-18 DIAGNOSIS — M5432 Sciatica, left side: Secondary | ICD-10-CM

## 2017-04-18 DIAGNOSIS — Z130 Encounter for screening for diseases of the blood and blood-forming organs and certain disorders involving the immune mechanism: Secondary | ICD-10-CM | POA: Diagnosis not present

## 2017-04-18 DIAGNOSIS — M79641 Pain in right hand: Secondary | ICD-10-CM

## 2017-04-18 DIAGNOSIS — Z8719 Personal history of other diseases of the digestive system: Secondary | ICD-10-CM | POA: Diagnosis not present

## 2017-04-18 DIAGNOSIS — M79642 Pain in left hand: Secondary | ICD-10-CM

## 2017-04-18 DIAGNOSIS — Z8669 Personal history of other diseases of the nervous system and sense organs: Secondary | ICD-10-CM

## 2017-04-18 LAB — CBC WITH DIFFERENTIAL/PLATELET
Basophils Absolute: 76 cells/uL (ref 0–200)
Basophils Relative: 1 %
EOS ABS: 0 {cells}/uL — AB (ref 15–500)
EOS PCT: 0 %
HEMATOCRIT: 41 % (ref 35.0–45.0)
HEMOGLOBIN: 13.5 g/dL (ref 11.7–15.5)
Lymphocytes Relative: 36 %
Lymphs Abs: 2736 cells/uL (ref 850–3900)
MCH: 31.8 pg (ref 27.0–33.0)
MCHC: 32.9 g/dL (ref 32.0–36.0)
MCV: 96.7 fL (ref 80.0–100.0)
MONO ABS: 608 {cells}/uL (ref 200–950)
MPV: 9.5 fL (ref 7.5–12.5)
Monocytes Relative: 8 %
NEUTROS PCT: 55 %
Neutro Abs: 4180 cells/uL (ref 1500–7800)
Platelets: 268 10*3/uL (ref 140–400)
RBC: 4.24 MIL/uL (ref 3.80–5.10)
RDW: 14.1 % (ref 11.0–15.0)
WBC: 7.6 10*3/uL (ref 3.8–10.8)

## 2017-04-19 LAB — COMPLETE METABOLIC PANEL WITH GFR
ALBUMIN: 4.3 g/dL (ref 3.6–5.1)
ALK PHOS: 50 U/L (ref 33–130)
ALT: 16 U/L (ref 6–29)
AST: 22 U/L (ref 10–35)
BUN: 15 mg/dL (ref 7–25)
CALCIUM: 9.7 mg/dL (ref 8.6–10.4)
CO2: 24 mmol/L (ref 20–31)
Chloride: 105 mmol/L (ref 98–110)
Creat: 0.77 mg/dL (ref 0.50–1.05)
GFR, Est African American: >89 mL/min (ref >=60)
GFR, Est Non African American: 85 mL/min (ref >=60)
Glucose, Bld: 102 mg/dL — ABNORMAL HIGH (ref 65–99)
Potassium: 4.8 mmol/L (ref 3.5–5.3)
Sodium: 139 mmol/L (ref 135–146)
Total Bilirubin: 0.4 mg/dL (ref 0.2–1.2)
Total Protein: 6.8 g/dL (ref 6.1–8.1)

## 2017-04-21 LAB — GLUCOSE 6 PHOSPHATE DEHYDROGENASE: G-6PDH: 13.1 U/g{Hb} (ref 7.0–20.5)

## 2017-04-21 LAB — CYCLIC CITRUL PEPTIDE ANTIBODY, IGG: Cyclic Citrullin Peptide Ab: <16 Units

## 2017-04-21 NOTE — Progress Notes (Signed)
WNL

## 2017-05-23 ENCOUNTER — Ambulatory Visit: Payer: Self-pay | Admitting: Rheumatology

## 2017-06-03 ENCOUNTER — Telehealth: Payer: Self-pay | Admitting: Rheumatology

## 2017-06-03 NOTE — Telephone Encounter (Signed)
Patient called and wanted to know if she can combine her NPT FU and her US in one visit.  She would like to do both on July 18th.  CB#[phone].  Thank you.

## 2017-06-04 NOTE — Telephone Encounter (Signed)
Please contact patient to reschedule ultrasound and new patient follow up on for the same day. Unable to do them both on 07/02/17. Thanks!

## 2017-06-06 ENCOUNTER — Ambulatory Visit: Payer: Self-pay | Admitting: Rheumatology

## 2017-07-01 ENCOUNTER — Ambulatory Visit: Payer: Self-pay | Admitting: Rheumatology

## 2017-07-02 ENCOUNTER — Ambulatory Visit: Payer: BC Managed Care – PPO | Admitting: Rheumatology

## 2017-07-08 ENCOUNTER — Ambulatory Visit: Payer: Self-pay | Admitting: Rheumatology

## 2017-07-31 ENCOUNTER — Other Ambulatory Visit: Payer: Self-pay

## 2017-07-31 MED ORDER — TRAMADOL HCL 50 MG PO TABS: 50.0000 mg | ORAL_TABLET | Freq: Two times a day (BID) | ORAL | 2 refills | Status: DC | PRN

## 2017-08-01 ENCOUNTER — Other Ambulatory Visit: Payer: Self-pay | Admitting: Gastroenterology

## 2017-08-01 DIAGNOSIS — R112 Nausea with vomiting, unspecified: Secondary | ICD-10-CM

## 2017-08-21 ENCOUNTER — Other Ambulatory Visit: Payer: BC Managed Care – PPO

## 2017-08-22 ENCOUNTER — Other Ambulatory Visit: Payer: BC Managed Care – PPO

## 2017-08-26 ENCOUNTER — Ambulatory Visit
Admission: RE | Admit: 2017-08-26 | Discharge: 2017-08-26 | Disposition: A | Discharge status: Home / Self Care | Payer: BC Managed Care – PPO | Source: Ambulatory Visit | Attending: Gastroenterology | Admitting: Gastroenterology

## 2017-08-26 DIAGNOSIS — R112 Nausea with vomiting, unspecified: Secondary | ICD-10-CM

## 2017-09-18 ENCOUNTER — Ambulatory Visit (INDEPENDENT_AMBULATORY_CARE_PROVIDER_SITE_OTHER): Payer: BC Managed Care – PPO | Admitting: Sports Medicine

## 2017-09-18 ENCOUNTER — Encounter: Payer: Self-pay | Admitting: Sports Medicine

## 2017-09-18 VITALS — BP 95/64 | Ht 66.5 in | Wt 130.0 lb

## 2017-09-18 DIAGNOSIS — S82142D Displaced bicondylar fracture of left tibia, subsequent encounter for closed fracture with routine healing: Secondary | ICD-10-CM

## 2017-09-18 DIAGNOSIS — R269 Unspecified abnormalities of gait and mobility: Secondary | ICD-10-CM | POA: Diagnosis not present

## 2017-09-18 DIAGNOSIS — S62001D Unspecified fracture of navicular [scaphoid] bone of right wrist, subsequent encounter for fracture with routine healing: Secondary | ICD-10-CM | POA: Diagnosis not present

## 2017-09-18 MED ORDER — HYDROCODONE-ACETAMINOPHEN 5-325 MG PO TABS: 1.0000 | ORAL_TABLET | Freq: Four times a day (QID) | ORAL | 0 refills | Status: DC | PRN

## 2017-09-19 ENCOUNTER — Encounter: Payer: Self-pay | Admitting: Sports Medicine

## 2017-09-19 DIAGNOSIS — S62001D Unspecified fracture of navicular [scaphoid] bone of right wrist, subsequent encounter for fracture with routine healing: Secondary | ICD-10-CM | POA: Insufficient documentation

## 2017-09-19 DIAGNOSIS — S82142A Displaced bicondylar fracture of left tibia, initial encounter for closed fracture: Secondary | ICD-10-CM | POA: Insufficient documentation

## 2017-09-19 NOTE — Assessment & Plan Note (Signed)
Patient presenting with acute traumatic right scaphoid fracture. X-rays taken at the time of injury reviewed today and show a scaphoid chip/avulsion fracture. Body of the scaphoid seems relatively unaffected from this injury. Fracture deemed low likelihood of non/malunion. - Continue her splint. - Ice as needed - Tylenol for pain - Follow-up 2 weeks. We'll repeat x-rays at that time. (X-rays ordered today: Right-sided AP, lateral, and scaphoid views)

## 2017-09-19 NOTE — Progress Notes (Signed)
   HPI  CC: Knee and wrist pain Patient is presenting today after an acute injury in which she fell and injured her right wrist and left knee. She states that she had been not paying attention when she was walking and tripped over a curve. She fell directly onto her left knee and extended right wrist. She was seen immediately after this injury at a different healthcare facility in which they provided a wrist splint and knee wrap stating that she had a contusion of her left knee and a fracture of her right scaphoid. She was later seen by an orthopedist who ordered a left knee MRI. Her main concern is she has these results and a tibial plateau fracture was diagnosed. She was looking for further management recommendations at this time.  Patient walked in to today's appointment using a cane. She states that her pain has been mild to moderately controlled but she has difficulty sleeping due to issues of discomfort from this pain.  She denies any fevers, chills, headache, nausea, vomiting, diarrhea, weakness, numbness, or paresthesias.  Medications/Interventions Tried: tylenol and ice  See HPI and/or previous note for associated ROS.  Objective: BP 95/64   Ht 5' 6.5" (1.689 m)   Wt 130 lb (59 kg)   BMI 20.67 kg/m  Gen: NAD, well groomed, a/o x3, normal affect.  CV: Well-perfused. Warm.  Resp: Non-labored.  Neuro: Sensation intact throughout. No gross coordination deficits.  Gait: Nonpathologic posture. Wrist, right: TTP noted at the anatomic snuffbox as well as the palmar aspect of the thenar eminence. Inspection yielded significant ecchymosis, and mild swelling. No bony deformity. ROM limited 2/2 pain; tendons without tenderness/swelling. Strength 5/5 in all directions. Knee, left: TTP noted at the anterior aspect of the knee with greatest tenderness over the medial side. Inspection was positive for ecchymosis, and mild effusion. No obvious bony abnormalities or signs of osteophyte development.  Palpation yielded slight asymmetric warmth; moderate patellar tenderness; No patellar crepitus. Patellar and quadriceps tendons unremarkable, and no tenderness of the pes anserine bursa. ROM unable to be tested secondary to discomfort, knee extension intact. Neurovascularly intact bilaterally.   Assessment and plan:  Closed nondisplaced fracture of scaphoid of right wrist with routine healing Patient presenting with acute traumatic right scaphoid fracture. X-rays taken at the time of injury reviewed today and show a scaphoid chip/avulsion fracture. Body of the scaphoid seems relatively unaffected from this injury. Fracture deemed low likelihood of non/malunion. - Continue her splint. - Ice as needed - Tylenol for pain - Follow-up 2 weeks. We'll repeat x-rays at that time. (X-rays ordered today: Right-sided AP, lateral, and scaphoid views)  Closed fracture of left tibial plateau Patient presenting after acute traumatic left tibial plateau fracture. Nondisplaced. Unable to be identified with x-ray but MRI showed an obvious incomplete fracture. - Knee mobilizer provided today. Continue 2 weeks - Nonweightbearing, strict, 2 weeks - Crutches provided today. - Patient follow-up in 2-3 weeks - Repeat x-rays in 2-3 weeks. (X-rays ordered: Two-view left knee, AP and lateral)   Meds ordered this encounter  Medications  . HYDROcodone-acetaminophen (NORCO/VICODIN) 5-325 MG tablet    Sig: Take 1 tablet by mouth every 6 (six) hours as needed for moderate pain.    Dispense:  30 tablet    Refill:  0     Kathee Delton, MD,MS [facility] Medicine Fellow 09/19/2017 12:41 PM

## 2017-09-19 NOTE — Assessment & Plan Note (Signed)
Patient presenting after acute traumatic left tibial plateau fracture. Nondisplaced. Unable to be identified with x-ray but MRI showed an obvious incomplete fracture. - Knee mobilizer provided today. Continue 2 weeks - Nonweightbearing, strict, 2 weeks - Crutches provided today. - Patient follow-up in 2-3 weeks - Repeat x-rays in 2-3 weeks. (X-rays ordered: Two-view left knee, AP and lateral)

## 2017-09-22 ENCOUNTER — Other Ambulatory Visit: Payer: BC Managed Care – PPO

## 2017-09-22 ENCOUNTER — Other Ambulatory Visit: Payer: Self-pay | Admitting: Internal Medicine

## 2017-09-22 ENCOUNTER — Ambulatory Visit
Admission: RE | Admit: 2017-09-22 | Discharge: 2017-09-22 | Disposition: A | Discharge status: Home / Self Care | Payer: BC Managed Care – PPO | Source: Ambulatory Visit | Attending: Internal Medicine | Admitting: Internal Medicine

## 2017-09-22 DIAGNOSIS — R110 Nausea: Secondary | ICD-10-CM

## 2017-09-22 DIAGNOSIS — R51 Headache: Principal | ICD-10-CM

## 2017-09-22 DIAGNOSIS — S0990XA Unspecified injury of head, initial encounter: Secondary | ICD-10-CM

## 2017-09-22 DIAGNOSIS — R519 Headache, unspecified: Secondary | ICD-10-CM

## 2017-09-22 DIAGNOSIS — H5702 Anisocoria: Secondary | ICD-10-CM

## 2017-09-30 ENCOUNTER — Encounter: Payer: Self-pay | Admitting: Sports Medicine

## 2017-09-30 ENCOUNTER — Ambulatory Visit (INDEPENDENT_AMBULATORY_CARE_PROVIDER_SITE_OTHER): Payer: BC Managed Care – PPO | Admitting: Sports Medicine

## 2017-09-30 ENCOUNTER — Ambulatory Visit
Admission: RE | Admit: 2017-09-30 | Discharge: 2017-09-30 | Disposition: A | Discharge status: Home / Self Care | Payer: BC Managed Care – PPO | Source: Ambulatory Visit | Attending: Sports Medicine | Admitting: Sports Medicine

## 2017-09-30 ENCOUNTER — Other Ambulatory Visit: Payer: Self-pay | Admitting: *Deleted

## 2017-09-30 VITALS — BP 102/60 | Ht 66.5 in | Wt 125.0 lb

## 2017-09-30 DIAGNOSIS — M25531 Pain in right wrist: Secondary | ICD-10-CM

## 2017-09-30 NOTE — Addendum Note (Signed)
Addended by: Rutha Bouchard E on: 09/30/2017 04:05 PM   Modules accepted: Orders

## 2017-10-01 NOTE — Progress Notes (Signed)
   Subjective:    Patient ID: Wendy Morse, female    DOB: [birth year], 61 y.o.   MRN: [id number]  HPI chief complaint: Right wrist pain  Wendy Morse comes in today complaining of right wrist pain that started earlier today after a fall. She is currently being treated for a nondisplaced fracture through the distal pole of the right scaphoid. She is in a thumb spica brace. Earlier today she fell while coming up some stairs. She landed on the ulnar aspect of her right wrist. This is primarily where her pain is. She did have her brace on when she fell. She denies numbness or tingling. She is also currently being treated for a nondisplaced tibial plateau fracture of her left knee. She is nonweightbearing on crutches. Doing well from this standpoint. She is scheduled to follow-up with Dr. Darrick PennaFields again next week.     Review of Systems     as above  Objective:   Physical Exam   well-developed, well-nourished. No acute distress  Right wrist: Patient does have some slight limited range of motion but this is possibly due to immobilization in her thumb spica brace. No obvious swelling. She is tender to palpation along the ulnar aspect of the wrist. Also some tenderness in the anatomic snuff box. Good pulses. Sensation is intact to light-touch.  X-rays of the right wrist including AP, lateral, and scaphoid views are obtained. Please note that her original x-rays of this wrist are not available for my review. She has a nondisplaced fracture through the distal pole of the scaphoid. It appears to be an incomplete fracture involving about 30% of the joint space. There may also be a nondisplaced ulnar styloid fracture but without the previous films to compare to I'm not sure if this is related to her original injury or her fall earlier today.      Assessment & Plan:   Right wrist pain status post fall earlier today  Nondisplaced scaphoid fracture, right wrist -stable  Questionable nondisplaced ulnar  styloid fracture   Patient will remain in her thumb spica brace. If she has suffered a new ulnar styloid fracture then this will be adequate protection. She will follow-up as scheduled with Dr. Darrick PennaFields next week but will call with questions or concerns in the interim.

## 2017-10-07 ENCOUNTER — Other Ambulatory Visit: Payer: Self-pay | Admitting: Sports Medicine

## 2017-10-07 ENCOUNTER — Ambulatory Visit
Admission: RE | Admit: 2017-10-07 | Discharge: 2017-10-07 | Disposition: A | Discharge status: Home / Self Care | Payer: BC Managed Care – PPO | Source: Ambulatory Visit | Attending: Sports Medicine | Admitting: Sports Medicine

## 2017-10-07 ENCOUNTER — Encounter: Payer: Self-pay | Admitting: Sports Medicine

## 2017-10-07 ENCOUNTER — Ambulatory Visit (INDEPENDENT_AMBULATORY_CARE_PROVIDER_SITE_OTHER): Payer: BC Managed Care – PPO | Admitting: Sports Medicine

## 2017-10-07 VITALS — BP 105/57 | Ht 66.5 in | Wt 125.0 lb

## 2017-10-07 DIAGNOSIS — M25531 Pain in right wrist: Secondary | ICD-10-CM

## 2017-10-07 DIAGNOSIS — M25562 Pain in left knee: Secondary | ICD-10-CM

## 2017-10-07 NOTE — Progress Notes (Signed)
Subjective: Wendy Morse is a 61 year old female who presents to the sports medicine office today for follow-up of right wrist pain and left knee pain. She has known history of nondisplaced left tibial plateau fracture and nondisplaced Fracture through the distal pole of the right scaphoid.  She is currently in a knee immobilizer and a thumb spica brace. She was seen in the office about a week ago after a fall. X-rays were done again and there was questionable chip fracture of the ulnar styloid. She reports still having pain today, both are equally as bothersome.  She describes the pain as a 3/10 today.  She describes the pain as a throbbing pain and occasionally sharp pain.   She does not report of any interval injury or trauma.  She does not report of any numbness, tingling, or burning paresthesias. She does not report of any fevers, chills, or night sweats.  Review of systems as above.  Objective: Gen: The patient appears healthy no acute distress alert and oriented.  HEENT: Moist oral mucosa  Resp: Normal respirations Cards: Normal peripheral pulses Skin: No rashes on visible skin Psych: Mood is described as good MSK: Inspection of her right wrist reveals minimal swelling along the dorsal compartment, she is tender to palpation along the ulnar aspect of the wrist slight tenderness to palpation over  The distal radius, but no tenderness to palpation over anatomic snuffbox she does have limitations in wrist flexion and less extension secondary to pain, distal pulses are intact, sensation 2+, inspection of her left knee reveals  She does have slight healing ecchymosis along the proximal third of the anterior tibia, She is tender to palpation along the medial joint line, no warmth or erythema noted, She is able to minimally flex and extend her left knee without any pain  Limited musculoskeletal ultrasound was performed in the office today ofher right wrist.  It appears  That she does haveslight chip fracture  of the ulnar styloid, has slight swelling and hypoechoic changes around the distal scaphoid.  Limited muscular skeletal ultrasound was performed in the office today ofher left knee, specifically of the medial tibia, she does have hypoechoic changes seen above the tibia consistent with effusion, with evidence of neovascularization seen on color-flow Doppler.  Assessment: 1. Left knee pain, with left nondisplaced tibial plateau fracture 2. Right wrist pain, with nondisplaced distal scaphoid fracture and nondisplaced ulnar styloid fracture  Plan: Discussed with Wendy Morse that over the next 3 weeks she can do partial weightbearing on her left leg, for week 1 to do 50% weightbearing, week 2 do 75% weightbearing, week 3 do 80% weightbearing.  Discussed that she should come out of her immobilizer twice daily and do gentle range of motion exercises. She will still stay in thumb spica brace for the next 3 weeks.  She will follow up here in the office in 3 weeks or sooner as needed, will hopefully advance to have her out of knee immobilizer as well as thumb spica brace.   Wendy Kernshristopher Lake, MD Primary Care Sports Medicine Fellow [facility]

## 2017-10-08 MED ORDER — GABAPENTIN 300 MG PO CAPS: 300.0000 mg | ORAL_CAPSULE | Freq: Every evening | ORAL | 0 refills | Status: DC | PRN

## 2017-10-08 NOTE — Addendum Note (Signed)
Addended by: Rutha BouchardBABNIK, Chrishawn Kring E on: 10/08/2017 10:47 AM   Modules accepted: Orders

## 2017-10-23 ENCOUNTER — Ambulatory Visit: Payer: BC Managed Care – PPO | Admitting: Sports Medicine

## 2017-10-23 ENCOUNTER — Encounter: Payer: Self-pay | Admitting: Sports Medicine

## 2017-10-23 DIAGNOSIS — S82142D Displaced bicondylar fracture of left tibia, subsequent encounter for closed fracture with routine healing: Secondary | ICD-10-CM

## 2017-10-23 DIAGNOSIS — S62001D Unspecified fracture of navicular [scaphoid] bone of right wrist, subsequent encounter for fracture with routine healing: Secondary | ICD-10-CM

## 2017-10-23 NOTE — Assessment & Plan Note (Addendum)
Patient seems to be healing well after suffering a left-sided tibial plateau fracture.  We have been treating conservatively and patient appears to be regaining function well.  No setbacks at this time. -Wean out of crutches as tolerated -Continue to avoid regular exercise.  May begin stationary bike with no resistance PRN/as tolerated -Home exercises to begin on range of motion (focus on knee extension) -Long discussion about using pain as a guide. -RICE therapy as needed -Follow-up 4 weeks

## 2017-10-23 NOTE — Progress Notes (Signed)
   HPI  CC: Follow-up right wrist and left knee injuries Patient is presenting today for follow-up regarding her acute traumatic right wrist and left knee injuries.  She states that her right wrist which sustained a scaphoid and ulnar styloid chip fracture has gotten much better over the past few weeks.  She has been compliant with her wrist brace but has been also actively attempting to increase her range of motion.  Pain is been minimal and she has had no recurrent swelling.  Strength and sensation are intact.  She has had no recent setbacks.  Regarding her left knee, patient sustained a nondisplaced tibial plateau fracture.  She has been nonweightbearing and in a knee immobilizer for many weeks.  She states that she has been attempting to ambulate without her crutches over the past 1 week.  She has had no setbacks.  Continues to have pain to palpation along the medial and lateral aspects of her knee.  Swelling persists but has not gotten worse.  Range of motion is limited but improving with each day.  She denies any numbness, weakness, or paresthesias.  No recent setbacks.  Medications/Interventions Tried: Conservative management, immobilization, RICE.  See HPI and/or previous note for associated ROS.  Objective: BP 108/70   Ht 5\' 6"  (1.676 m)   Wt 130 lb (59 kg)   BMI 20.98 kg/m  Gen: NAD, well groomed, a/o x3, normal affect.  CV: Well-perfused. Warm.  Resp: Non-labored.  Neuro: Sensation intact throughout. No gross coordination deficits.  Gait: Nonpathologic posture, No evidence of balance issues. Wrist, right: Minimal TTP noted at the anatomic snuffbox as well as the palmar aspect of the thenar eminence (much improved). Inspection yielded no ecchymosis and no swelling. No bony deformity. ROM limited slightly w/ no exacerbation; tendons without tenderness/swelling. Strength 5/5 in all directions. Knee, left: TTP noted at the medial and lateral aspects of the knee. Inspection was positive  for swelling/edema along the medial aspect of the knee and pes anserine, extending distally into the mid-lower leg. No obvious bony abnormalities. Palpation yielded slight asymmetric warmth; moderate patellar tenderness; No patellar crepitus. Patellar and quadriceps tendons unremarkable. ROM with ~110 of knee flexion and 5-10 of extension. Neurovascularly intact bilaterally.  Able to weight-bear without significant discomfort, but limp noted 2/2 limited knee extension.  Assessment and plan:  Closed nondisplaced fracture of scaphoid of right wrist with routine healing Patient seems to be healing well after an acute traumatic scaphoid and ulnar styloid chip fracture. -Wean out of brace. -Compressive sleeve. -RICE therapy as needed -Use pain as a guide  Closed fracture of left tibial plateau Patient seems to be healing well after suffering a left-sided tibial plateau fracture.  We have been treating conservatively and patient appears to be regaining function well.  No setbacks at this time. -Wean out of crutches as tolerated -Continue to avoid regular exercise.  May begin stationary bike with no resistance PRN/as tolerated -Home exercises to begin on range of motion (focus on knee extension) -Long discussion about using pain as a guide. -RICE therapy as needed -Follow-up 4 weeks   Kathee DeltonIan D Noami Bove, MD,MS [facility] Fellow 10/23/2017 1:02 PM

## 2017-10-23 NOTE — Assessment & Plan Note (Signed)
Patient seems to be healing well after an acute traumatic scaphoid and ulnar styloid chip fracture. -Wean out of brace. -Compressive sleeve. -RICE therapy as needed -Use pain as a guide

## 2017-10-27 ENCOUNTER — Encounter: Payer: Self-pay | Admitting: Sports Medicine

## 2017-10-30 ENCOUNTER — Ambulatory Visit: Payer: BC Managed Care – PPO | Admitting: Sports Medicine

## 2017-11-21 NOTE — Progress Notes (Deleted)
Visit Diagnoses: Rheumatoid factor positive -she appears to have some synovial thickening of right second MCP joint but no synovitis was noted. Plan: Cyclic citrul peptide antibody, IgG I will also schedule ultrasound of bilateral hands to evaluate this further.  Anti-CCP negative

## 2017-11-25 ENCOUNTER — Ambulatory Visit: Payer: BC Managed Care – PPO | Admitting: Sports Medicine

## 2017-11-26 ENCOUNTER — Ambulatory Visit: Payer: BC Managed Care – PPO | Admitting: Rheumatology

## 2017-11-26 ENCOUNTER — Inpatient Hospital Stay (INDEPENDENT_AMBULATORY_CARE_PROVIDER_SITE_OTHER): Payer: Self-pay

## 2017-11-26 ENCOUNTER — Encounter: Payer: Self-pay | Admitting: Rheumatology

## 2017-11-26 ENCOUNTER — Other Ambulatory Visit: Payer: BC Managed Care – PPO | Admitting: Rheumatology

## 2017-11-26 VITALS — BP 109/66 | HR 58 | Resp 15 | Ht 66.0 in | Wt 130.0 lb

## 2017-11-26 DIAGNOSIS — Z8659 Personal history of other mental and behavioral disorders: Secondary | ICD-10-CM | POA: Diagnosis not present

## 2017-11-26 DIAGNOSIS — Z8719 Personal history of other diseases of the digestive system: Secondary | ICD-10-CM

## 2017-11-26 DIAGNOSIS — Z8669 Personal history of other diseases of the nervous system and sense organs: Secondary | ICD-10-CM | POA: Diagnosis not present

## 2017-11-26 DIAGNOSIS — M19042 Primary osteoarthritis, left hand: Secondary | ICD-10-CM

## 2017-11-26 DIAGNOSIS — M79642 Pain in left hand: Secondary | ICD-10-CM | POA: Diagnosis not present

## 2017-11-26 DIAGNOSIS — I7300 Raynaud's syndrome without gangrene: Secondary | ICD-10-CM | POA: Diagnosis not present

## 2017-11-26 DIAGNOSIS — M19072 Primary osteoarthritis, left ankle and foot: Secondary | ICD-10-CM

## 2017-11-26 DIAGNOSIS — R899 Unspecified abnormal finding in specimens from other organs, systems and tissues: Secondary | ICD-10-CM | POA: Diagnosis not present

## 2017-11-26 DIAGNOSIS — M542 Cervicalgia: Secondary | ICD-10-CM | POA: Diagnosis not present

## 2017-11-26 DIAGNOSIS — M51369 Other intervertebral disc degeneration, lumbar region without mention of lumbar back pain or lower extremity pain: Secondary | ICD-10-CM

## 2017-11-26 DIAGNOSIS — M79641 Pain in right hand: Secondary | ICD-10-CM | POA: Diagnosis not present

## 2017-11-26 DIAGNOSIS — M19071 Primary osteoarthritis, right ankle and foot: Secondary | ICD-10-CM | POA: Diagnosis not present

## 2017-11-26 DIAGNOSIS — M19041 Primary osteoarthritis, right hand: Secondary | ICD-10-CM

## 2017-11-26 DIAGNOSIS — M5136 Other intervertebral disc degeneration, lumbar region: Secondary | ICD-10-CM

## 2017-11-26 NOTE — Progress Notes (Signed)
Office Visit Note  Patient: Wendy Morse             Date of Birth: [birth year]           MRN: [id number]             PCP: Wendy Huddle, MD Referring: Wendy Huddle, MD Visit Date: 11/26/2017 Occupation: _0 @    Subjective:  Pain hands.   History of Present Illness: Wendy Morse is a 61 y.o. female with history of polyarthralgias. She states in September she fell on the sidewalk and fractured her left tibia and right wrist. She was in a cast for 6 weeks followed by crutches. She's gradually recovered from that. He continues to have some discomfort in her bilateral hands and bilateral feet. [name] is not as severe this winter. She continues to have some lower back discomfort. She's also been experiencing some neck pain and stiffness. She states she was using crutches which could have flared her C-spine discomfort.  Activities of Daily Living:  Patient reports morning stiffness for 5 minutes.   Patient Denies nocturnal pain.  Difficulty dressing/grooming: Denies Difficulty climbing stairs: Reports Difficulty getting out of chair: Denies Difficulty using hands for taps, buttons, cutlery, and/or writing: Denies   Review of Systems  Constitutional: Positive for fatigue. Negative for night sweats, weight gain, weight loss and weakness.  HENT: Positive for mouth dryness. Negative for mouth sores, trouble swallowing, trouble swallowing and nose dryness.   Eyes: Positive for dryness. Negative for pain, redness and visual disturbance.  Respiratory: Negative for cough, shortness of breath and difficulty breathing.   Cardiovascular: Negative for chest pain, palpitations, hypertension, irregular heartbeat and swelling in legs/feet.  Gastrointestinal: Negative for blood in stool, constipation and diarrhea.  Endocrine: Negative for increased urination.  Genitourinary: Negative for vaginal dryness.  Musculoskeletal: Positive for arthralgias, joint pain and morning stiffness.  Negative for joint swelling, myalgias, muscle weakness, muscle tenderness and myalgias.  Skin: Positive for color change. Negative for rash, hair loss, skin tightness, ulcers and sensitivity to sunlight.  Allergic/Immunologic: Negative for susceptible to infections.  Neurological: Negative for dizziness, memory loss and night sweats.  Hematological: Negative for swollen glands.  Psychiatric/Behavioral: Positive for sleep disturbance. Negative for depressed mood. The patient is nervous/anxious.     PMFS History:  Patient Active Problem List   Diagnosis Date Noted  . Closed nondisplaced fracture of scaphoid of right wrist with routine healing 09/19/2017  . Closed fracture of left tibial plateau 09/19/2017  . Rheumatoid factor positive 04/17/2017  . Multiple joint pain 04/17/2017  . History of gastroesophageal reflux (GERD) 04/17/2017  . History of skin cancer 04/17/2017  . History of migraine 04/17/2017  . History of anxiety 04/17/2017  . Hypercalcemia 04/17/2017  . Primary insomnia 04/17/2017  . Chronic sciatica of left side 04/06/2015  . Shoulder pain, right 03/02/2012  . HALLUX RIGIDUS, ACQUIRED 02/05/2011  . METATARSALGIA 11/06/2010  . CLOSED FRACTURE OF METATARSAL BONE 10/09/2010  . [place], [place] 10/18/2009  . ABNORMALITY OF GAIT 10/18/2009  . HEEL PAIN 09/13/2009    History reviewed. No pertinent past medical history.  History reviewed. No pertinent family history. Past Surgical History:  Procedure Laterality Date  . APPENDECTOMY     Social History   Social History Narrative  . Not on file     Objective: Vital Signs: BP 109/66 (BP Location: Right Arm, Patient Position: Sitting, Cuff Size: Normal)   Pulse (!) 58   Resp 15   Ht _1  (1.676  m)   Wt 130 lb (59 kg)   BMI 20.98 kg/m    Physical Exam  Constitutional: She is oriented to person, place, and time. She appears well-developed and well-nourished.  HENT:  Head: Normocephalic and atraumatic.  Eyes:  Conjunctivae and EOM are normal.  Neck: Normal range of motion.  Cardiovascular: Normal rate, regular rhythm, normal heart sounds and intact distal pulses.  Pulmonary/Chest: Effort normal and breath sounds normal.  Abdominal: Soft. Bowel sounds are normal.  Lymphadenopathy:    She has no cervical adenopathy.  Neurological: She is alert and oriented to person, place, and time.  Skin: Skin is warm and dry. Capillary refill takes less than 2 seconds.  Psychiatric: She has a normal mood and affect. Her behavior is normal.  Nursing note and vitals reviewed.    Musculoskeletal Exam: C-spine some stiffness with range of motion. Lumbar spine discomfort range of motion. Shoulder joints elbow joints wrist joint MCPs PIPs with good range of motion. No synovitis was noted. She has DIP and PIP thickening in her hands and feet consistent with osteoarthritis. She also has hammertoes bilaterally. Hip joints and knee joints are good range of motion. She discomfort range of motion of her left knee joint due to recent fracture.  CDAI Exam: No CDAI exam completed.    Investigation: Findings:  02/06/2017 ANA, ds DNA, CRP, Hep C Abnegative, and ESR 5.  Rheumatoid factor elevated 19.7  CBC Latest Ref Rng & Units 04/18/2017  WBC 3.8 - 10.8 K/uL 7.6  Hemoglobin 11.7 - 15.5 g/dL 13.5  Hematocrit 35.0 - 45.0 % 41.0  Platelets 140 - 400 K/uL 268   CMP     Component Value Date/Time   NA 139 04/18/2017 1535   K 4.8 04/18/2017 1535   CL 105 04/18/2017 1535   CO2 24 04/18/2017 1535   GLUCOSE 102 (H) 04/18/2017 1535   BUN 15 04/18/2017 1535   CREATININE 0.77 04/18/2017 1535   CALCIUM 9.7 04/18/2017 1535   PROT 6.8 04/18/2017 1535   ALBUMIN 4.3 04/18/2017 1535   AST 22 04/18/2017 1535   ALT 16 04/18/2017 1535   ALKPHOS 50 04/18/2017 1535   BILITOT 0.4 04/18/2017 1535   GFRNONAA 85 04/18/2017 1535   GFRAA >89 04/18/2017 1535  Anti-CCP<16, G6PD normal  Imaging: [place] Extrem Up Bilat Comp  Result Date:  11/26/2017 Ultrasound examination of bilateral hands was performed per EULAR recommendations. Using 12 MHz transducer, grayscale and power Doppler bilateral second, third, and fifth MCP joints and bilateral wrist joints both dorsal and volar aspects were evaluated to look for synovitis or tenosynovitis. The findings were there was no synovitis or tenosynovitis on ultrasound examination. Right median nerve was 0.06 cm squares which was within normal limits and left median nerve was 0.07 cm squares which was within normal limits. Impression: Ultrasound examination did not show inflammatory arthritis. Median nerves are within normal limits.   Speciality Comments: No specialty comments available.    Procedures:  No procedures performed Allergies: Bee venom; Ciprofloxacin; Dilaudid [hydromorphone hcl]; Latex; Penicillins; and Sulfur   Assessment / Plan:     Visit Diagnoses: Pain in both hands -she continues to have some pain and stiffness in her bilateral hands. The ultrasound examination was performed today Plan: [place] Extrem Up Bilat Comp. The ultrasound did not show any synovitis on examination I believe most of her symptoms are coming from underlying osteoarthritis.  Rheumatid factor positive: Her  anti-CCP antibody was negative. Her sedimentation rate was normal. I've advised patient  if she develops any increased joint inflammation or discomfort she should notify us in future.  Primary osteoarthritis of both hands - Mild. Detailed counseling regarding osteoarthritis was provided. Discussed some natural anti-inflammatories with patient. Joint protection and muscle strengthening was discussed.  Primary osteoarthritis of both feet - Mild. Proper fitting shoes with her support were discussed.  Raynaud's disease without gangrene: Her Raynaud's is not very active currently.  Neck pain: She's been having some neck stiffness. I offered x-rays but she declined. Have given her a handout on neck  exercises.  DDD (degenerative disc disease), lumbar: Chronic pain which is not flaring currently.  Other medical problems listed as follows:  History of anxiety  History of gastroesophageal reflux (GERD)  History of migraine    Orders: Orders Placed This Encounter  Procedures  . [place] Extrem Up Bilat Comp   No orders of the defined types were placed in this encounter.   Face-to-face time spent with patient was 30 minutes. Greater than 50% of time was spent in counseling and coordination of care.  Follow-Up Instructions: Return if symptoms worsen or fail to improve, for Osteoarthritis Raynauds DDD.   Bo Merino, MD  Note - This record has been created using Editor, commissioning.  Chart creation errors have been sought, but may not always  have been located. Such creation errors do not reflect on  the standard of medical care.

## 2017-11-26 NOTE — Patient Instructions (Addendum)
Cervical Strain and Sprain Rehab Ask your health care provider which exercises are safe for you. Do exercises exactly as told by your health care provider and adjust them as directed. It is normal to feel mild stretching, pulling, tightness, or discomfort as you do these exercises, but you should stop right away if you feel sudden pain or your pain gets worse.Do not begin these exercises until told by your health care provider. Stretching and range of motion exercises These exercises warm up your muscles and joints and improve the movement and flexibility of your neck. These exercises also help to relieve pain, numbness, and tingling. Exercise A: Cervical side bend  Using good posture, sit on a stable chair or stand up. Without moving your shoulders, slowly tilt your left / right ear to your shoulder until you feel a stretch in your neck muscles. You should be looking straight ahead. Hold for __________ seconds. Repeat with the other side of your neck. Repeat __________ times. Complete this exercise __________ times a day. Exercise B: Cervical rotation  Using good posture, sit on a stable chair or stand up. Slowly turn your head to the side as if you are looking over your left / right shoulder. Keep your eyes level with the ground. Stop when you feel a stretch along the side and the back of your neck. Hold for __________ seconds. Repeat this by turning to your other side. Repeat __________ times. Complete this exercise __________ times a day. Exercise C: Thoracic extension and pectoral stretch Roll a towel or a small blanket so it is about 4 inches (10 cm) in diameter. Lie down on your back on a firm surface. Put the towel lengthwise, under your spine in the middle of your back. It should not be not under your shoulder blades. The towel should line up with your spine from your middle back to your lower back. Put your hands behind your head and let your elbows fall out to your sides. Hold  for __________ seconds. Repeat __________ times. Complete this exercise __________ times a day. Strengthening exercises These exercises build strength and endurance in your neck. Endurance is the ability to use your muscles for a long time, even after your muscles get tired. Exercise D: Upper cervical flexion, isometric Lie on your back with a thin pillow behind your head and a small rolled-up towel under your neck. Gently tuck your chin toward your chest and nod your head down to look toward your feet. Do not lift your head off the pillow. Hold for __________ seconds. Release the tension slowly. Relax your neck muscles completely before you repeat this exercise. Repeat __________ times. Complete this exercise __________ times a day. Exercise E: Cervical extension, isometric  Stand about 6 inches (15 cm) away from a wall, with your back facing the wall. Place a soft object, about 6-8 inches (15-20 cm) in diameter, between the back of your head and the wall. A soft object could be a small pillow, a ball, or a folded towel. Gently tilt your head back and press into the soft object. Keep your jaw and forehead relaxed. Hold for __________ seconds. Release the tension slowly. Relax your neck muscles completely before you repeat this exercise. Repeat __________ times. Complete this exercise __________ times a day. Posture and body mechanics  Body mechanics refers to the movements and positions of your body while you do your daily activities. Posture is part of body mechanics. Good posture and healthy body mechanics can help to relieve  stress in your body's tissues and joints. Good posture means that your spine is in its natural S-curve position (your spine is neutral), your shoulders are pulled back slightly, and your head is not tipped forward. The following are general guidelines for applying improved posture and body mechanics to your everyday activities. Standing When standing, keep your spine  neutral and keep your feet about hip-width apart. Keep a slight bend in your knees. Your ears, shoulders, and hips should line up. When you do a task in which you stand in one place for a long time, place one foot up on a stable object that is 2-4 inches (5-10 cm) high, such as a footstool. This helps keep your spine neutral. Sitting  When sitting, keep your spine neutral and your keep feet flat on the floor. Use a footrest, if necessary, and keep your thighs parallel to the floor. Avoid rounding your shoulders, and avoid tilting your head forward. When working at a desk or a computer, keep your desk at a height where your hands are slightly lower than your elbows. Slide your chair under your desk so you are close enough to maintain good posture. When working at a computer, place your monitor at a height where you are looking straight ahead and you do not have to tilt your head forward or downward to look at the screen. Resting When lying down and resting, avoid positions that are most painful for you. Try to support your neck in a neutral position. You can use a contour pillow or a small rolled-up towel. Your pillow should support your neck but not push on it. This information is not intended to replace advice given to you by your health care provider. Make sure you discuss any questions you have with your health care provider. Document Released: 12/02/2005 Document Revised: 08/08/2016 Document Reviewed: 11/08/2015 Elsevier Interactive Patient Education  2018 [employer]   Natural anti-inflammatories  You can purchase these at [employer], [employer] or online.  . Turmeric (capsules)  . Ginger (ginger root or capsules)  . Omega 3 (Fish, flax seeds, chia seeds, walnuts, almonds)  . Tart cherry (dried or extract)   Patient should be under the care of a physician while taking these supplements. This may not be reproduced without the permission of Dr. Pollyann SavoyShaili Zophia Marrone.

## 2017-11-27 ENCOUNTER — Encounter: Payer: Self-pay | Admitting: Sports Medicine

## 2017-11-27 ENCOUNTER — Ambulatory Visit (INDEPENDENT_AMBULATORY_CARE_PROVIDER_SITE_OTHER): Payer: BC Managed Care – PPO | Admitting: Sports Medicine

## 2017-11-27 ENCOUNTER — Ambulatory Visit: Payer: BC Managed Care – PPO | Admitting: Sports Medicine

## 2017-11-27 DIAGNOSIS — S62001D Unspecified fracture of navicular [scaphoid] bone of right wrist, subsequent encounter for fracture with routine healing: Secondary | ICD-10-CM | POA: Diagnosis not present

## 2017-11-27 DIAGNOSIS — S82142D Displaced bicondylar fracture of left tibia, subsequent encounter for closed fracture with routine healing: Secondary | ICD-10-CM | POA: Diagnosis not present

## 2017-11-27 MED ORDER — TRAMADOL HCL 50 MG PO TABS: 50.0000 mg | ORAL_TABLET | Freq: Two times a day (BID) | ORAL | 2 refills | Status: DC | PRN

## 2017-11-27 NOTE — Assessment & Plan Note (Signed)
Clinically healed with no deficit  Work on graip strength and wrist ROM  Reck prn

## 2017-11-27 NOTE — Patient Instructions (Signed)
For your neck: Be sure to maintain correct posture Perform isometric exercises in all 4 directions Be mindful and have easy gliding motion with neck movement Try vitamin B6 supplements  For your knee: Perform 1 leg mini squats as you can tolerate Perform lunges and squats as you can tolerate  Wrist: No need to force things... Resume your normal movement pattern.

## 2017-11-27 NOTE — Assessment & Plan Note (Signed)
Excellent healing Good ROM of knee  Expect some mild sxs Start some strength exercises  Reck prn

## 2017-11-27 NOTE — Progress Notes (Signed)
   Subjective:    Patient ID: Wendy Morse, female    DOB: [birth year], 61 y.o.   MRN: [id number]  HPI Wendy Morse is a 61 year old female with history of right sided nondisplaced fracture at the distal pole of the scaphoid and nondisplaced left tibial plateau fracture who presents for follow-up.  Patient states that the wrist is doing significantly better.  She only describes intermittent pain with heavy lifting and significant flexion but otherwise the wrist is pain-free.  The left knee has significantly improved as well.  She still does describe some pain located medially below the joint line on occasion.  She is able to ambulate without issue for up to 45 minutes.  Her goal is to be able to ambulate without issue for up to 1 hour.  Otherwise today she has no complaints.  Denies any numbness or tingling in the hands or feet  Review of Systems Constitutional: Negative MSK: +L knee pain, +R wrist pain, +intermittent neck pain    Objective:   Physical Exam  Thin F in NAD BP 100/60   Ht 5\' 6"  (1.676 m)   Wt 130 lb (59 kg)   BMI 20.98 kg/m   Knee,  TTP noted medially at the medial tibal plateau. Inspection was negative for erythema, ecchymosis, and effusion. No obvious bony abnormalities or signs of osteophyte development. No joint line tenderness; condyle tenderness; No patellar tenderness; mild patellar crepitus. Patellar and quadriceps tendons unremarkable, and no tenderness of the pes anserine bursa. No obvious Baker's cyst development. ROM mild tightness but full flexion and normal extension (0 degrees). Normal hamstring and quadriceps strength. Neurovascularly intact distally bilaterally.  Gait with no limp  Wrist:  Inspection yielded no erythema, ecchymosis, bony deformity, or swelling. ROM full with good flexion and extension and ulnar/radial deviation that is symmetrical with opposite wrist. Palpation is normal over metacarpals, scaphoid, lunate, and TFCC; tendons without  tenderness/swelling. Strength 5/5 in all directions without pain. Negative Finkelstein, tinel's and phalens.  Watson test neg/ no pain over snuff box    Assessment & Plan:  #L tibial plateau fx --Patient continues to do well with recovery.  As she has not yet fully regained complete active flexion however she is 5 degrees away from full flexion. - advised patient to continue graduated activity in terms of walking, lunges and squats as she is able.  #Right wrist -Patient with right nondisplaced scaphoid fracture.  She is nontender on exam today and only has minor pain with flexion. Does have counseled her that with continued caution in time, without reinjury, she will fully heal.  #Neck pain -Patient with reportedly years of intermittent neck pain I have advised the following.Marland Kitchen.Marland Kitchen.Be sure to maintain correct posture --Perform isometric exercises in all 4 directions --Be mindful and have easy gliding motion with neck movement --Try vitamin B6 supplements   Vickii PennaAdam Culver, MD

## 2017-12-02 ENCOUNTER — Ambulatory Visit: Payer: BC Managed Care – PPO | Admitting: Sports Medicine

## 2017-12-24 ENCOUNTER — Other Ambulatory Visit: Payer: BC Managed Care – PPO | Admitting: Rheumatology

## 2018-01-07 ENCOUNTER — Other Ambulatory Visit: Payer: BC Managed Care – PPO | Admitting: Rheumatology

## 2018-02-02 ENCOUNTER — Encounter: Payer: Self-pay | Admitting: Sports Medicine

## 2018-02-02 ENCOUNTER — Ambulatory Visit: Payer: BC Managed Care – PPO | Admitting: Sports Medicine

## 2018-02-02 VITALS — BP 90/52 | Ht 66.0 in | Wt 130.0 lb

## 2018-02-02 DIAGNOSIS — M7050 Other bursitis of knee, unspecified knee: Secondary | ICD-10-CM

## 2018-02-02 DIAGNOSIS — M25562 Pain in left knee: Secondary | ICD-10-CM

## 2018-02-02 MED ORDER — TRAMADOL HCL 50 MG PO TABS: 50.0000 mg | ORAL_TABLET | Freq: Two times a day (BID) | ORAL | 2 refills | Status: DC | PRN

## 2018-02-02 NOTE — Progress Notes (Signed)
   Subjective:    Patient ID: Wendy Morse, female    DOB: 1956-06-25, 62 y.o.   MRN: [id number]  HPI chief complaint: Left knee pain  Patient comes in today for follow-up on a nondisplaced tibial plateau fracture of her left knee. Injury was 5 months ago. She has been doing well overall. However, she recently began to experience some medial sided knee pain. She's noticed some swelling as well. It does not limit her activity but it is bothersome at times. No locking or catching. Pain will radiate into the posterior knee as well. No numbness or tingling. She has been wearing her body helix compression sleeve when active. She admits that she has not been as compliant with her home exercises.   Review of Systems    as above Objective:   Physical Exam  Well-developed, well-nourished. No acute distress. Awake alert and oriented 3. Vital signs reviewed  Left knee: Full range of motion. No effusion. There is some slight swelling in the proximal tibia at the insertion of the medial hamstring tendons. She is tender to palpation here. No joint line tenderness. Negative McMurray's. Negative Thessaly's. Knee is stable to valgus and varus stressing. Left thigh measures 16 inches which is 1 inch smaller than the uninvolved right leg. She is neurovascularly intact distally. Walking without a limp.  Brief bedside [facility] ultrasound shows swelling in the area of the pes anserine bursa. No joint effusion. Visualized portion of the medial meniscus looks unremarkable.      Assessment & Plan:   Left knee pain likely secondary to pes anserine bursitis Status post nondisplaced tibial plateau fracture left knee-doing well  Patient will use Voltaren gel as needed. She's instructed in hamstring stretches and strengthening exercises. I've also reinstructed her in quadriceps strengthening exercises. I do not think we need further diagnostic imaging at this time. She will continue with her compression sleeve when  active. Refill on her tramadol. Of note, she was also complaining of some left-sided neck and shoulder pain. Pain will radiate at times into her shoulder blade. No pain past the elbow. No numbness or tingling. She does have a documented history of cervical degenerative disc disease. An MRI of her cervical spine was done several years ago but it is not available for my review. I've asked her to drop off the MRI for me to review. I think her symptoms are worse when she is riding low on her bike closing off the cervical foramen. I recommend activity modification as needed. She has also been instructed in isometric neck exercises. We also talked briefly about getting a home traction unit. She will let me know if symptoms worsen. Follow-up as needed.

## 2018-02-04 ENCOUNTER — Encounter: Payer: Self-pay | Admitting: Sports Medicine

## 2018-05-01 ENCOUNTER — Ambulatory Visit
Admission: RE | Admit: 2018-05-01 | Discharge: 2018-05-01 | Disposition: A | Discharge status: Home / Self Care | Payer: BC Managed Care – PPO | Source: Ambulatory Visit | Attending: Internal Medicine | Admitting: Internal Medicine

## 2018-05-01 ENCOUNTER — Other Ambulatory Visit: Payer: Self-pay | Admitting: Internal Medicine

## 2018-05-01 DIAGNOSIS — R5383 Other fatigue: Secondary | ICD-10-CM

## 2018-05-01 DIAGNOSIS — R0789 Other chest pain: Secondary | ICD-10-CM

## 2018-05-01 DIAGNOSIS — R110 Nausea: Secondary | ICD-10-CM

## 2018-05-01 DIAGNOSIS — R630 Anorexia: Secondary | ICD-10-CM

## 2018-05-05 ENCOUNTER — Other Ambulatory Visit: Payer: Self-pay | Admitting: Nurse Practitioner

## 2018-05-05 DIAGNOSIS — Z1231 Encounter for screening mammogram for malignant neoplasm of breast: Secondary | ICD-10-CM

## 2018-05-14 ENCOUNTER — Ambulatory Visit
Admission: RE | Admit: 2018-05-14 | Discharge: 2018-05-14 | Disposition: A | Discharge status: Home / Self Care | Payer: BC Managed Care – PPO | Source: Ambulatory Visit | Attending: Nurse Practitioner | Admitting: Nurse Practitioner

## 2018-05-14 DIAGNOSIS — Z1231 Encounter for screening mammogram for malignant neoplasm of breast: Secondary | ICD-10-CM

## 2018-05-25 ENCOUNTER — Ambulatory Visit: Payer: BC Managed Care – PPO | Admitting: Sports Medicine

## 2018-05-28 ENCOUNTER — Ambulatory Visit: Payer: BC Managed Care – PPO

## 2018-07-27 ENCOUNTER — Ambulatory Visit (INDEPENDENT_AMBULATORY_CARE_PROVIDER_SITE_OTHER): Payer: BC Managed Care – PPO | Admitting: Sports Medicine

## 2018-07-27 ENCOUNTER — Encounter: Payer: Self-pay | Admitting: Sports Medicine

## 2018-07-27 VITALS — BP 100/56 | Ht 66.0 in | Wt 130.0 lb

## 2018-07-27 DIAGNOSIS — M25512 Pain in left shoulder: Secondary | ICD-10-CM

## 2018-07-27 DIAGNOSIS — M79641 Pain in right hand: Secondary | ICD-10-CM | POA: Diagnosis not present

## 2018-07-27 DIAGNOSIS — M25579 Pain in unspecified ankle and joints of unspecified foot: Secondary | ICD-10-CM | POA: Diagnosis not present

## 2018-07-28 ENCOUNTER — Encounter: Payer: Self-pay | Admitting: Sports Medicine

## 2018-07-28 NOTE — Progress Notes (Signed)
   Subjective:    Patient ID: Wendy Morse, female    DOB: 1956-02-14, 62 y.o.   MRN: [id number]  HPI   Wendy Morse comes in today with several complaints. Main complaint is right hand pain that she localizes to the first and second MCP joints. She describes pain and stiffness in the joints but has not noticed any swelling. She does see rheumatology and an ultrasound evaluation at her last rheumatology appointment did not show any evidence of synovitis. She denies burning or tingling. Denies pain elsewhere in the right upper extremity. She has tried some Voltaren gel without any benefit. Aleve has been helpful. Also complaining of some returning left shoulder pain. She does have a history of cervical radiculopathy and rotator cuff tendinopathy. She admits that she has not been doing her home exercises.Pain is primarily around the shoulder. Worse with activity. She's also requesting some new green inserts. She's beginning to get some arch pain and her current orthotics do not have scaphoid pads.    Review of Systems    as above Objective:   Physical Exam  Well developed, well-nourished. No acute distress. Awake alert and oriented 3. Vital signs reviewed  Right hand:There is some slight hypertrophy of the first and second MCP joints. Patient is able to make a complete fist. No soft tissue swelling. No tenderness to palpation. Good pulses. Normal skin color.  Left shoulder: Full range of motion with a positive painful arc. Excellent rotator cuff strength. No tenderness to palpation.  Neurovascular exam: No focal deficits of either upper or lower extremities grossly  Examination of her feet show some slight tenderness to palpation along the arch of her left foot. No swelling.  Bedside ultrasound of her right hand with attention to the MCP joints shows no synovitis. She does have spurring along the radial aspect of the second MCP joint consistent with mild osteoarthritis here.        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 I've not given her any specific treatment for this but if her symptoms worsen she may want to consider return to rheumatology. For her left shoulder pain, we have re-educated her in Jobe home exercises and scapular stabilization exercises. She is encouraged to do these 2-3 times a week. I've given her a pair of green sports insoles with scaphoid pads to try. If they are uncomfortable, she may return to using her older orthotics. Follow-up with me as needed.

## 2018-08-07 ENCOUNTER — Other Ambulatory Visit: Payer: Self-pay

## 2018-08-07 MED ORDER — TRAMADOL HCL 50 MG PO TABS: 50.0000 mg | ORAL_TABLET | Freq: Two times a day (BID) | ORAL | 2 refills | Status: DC | PRN

## 2018-09-03 ENCOUNTER — Encounter: Payer: Self-pay | Admitting: Sports Medicine

## 2018-09-03 ENCOUNTER — Ambulatory Visit
Admission: RE | Admit: 2018-09-03 | Discharge: 2018-09-03 | Disposition: A | Discharge status: Home / Self Care | Payer: BC Managed Care – PPO | Source: Ambulatory Visit | Attending: Sports Medicine | Admitting: Sports Medicine

## 2018-09-03 ENCOUNTER — Ambulatory Visit: Payer: BC Managed Care – PPO | Admitting: Sports Medicine

## 2018-09-03 ENCOUNTER — Other Ambulatory Visit: Payer: Self-pay | Admitting: *Deleted

## 2018-09-03 VITALS — BP 90/62 | Ht 66.0 in | Wt 130.0 lb

## 2018-09-03 DIAGNOSIS — M79641 Pain in right hand: Secondary | ICD-10-CM

## 2018-09-03 NOTE — Patient Instructions (Signed)
  Tumeric 500 mg up to three times daily  Boswellia 500 mg up to three times daily  Voltaren gel

## 2018-09-03 NOTE — Progress Notes (Signed)
dg 

## 2018-09-04 NOTE — Progress Notes (Signed)
   Subjective:    Patient ID: Wendy Morse, female    DOB: 10-23-1956, 62 y.o.   MRN: [id number]  HPI chief complaint: Right hand pain  [name] comes in today with concerns about her right hand.  Is complaining of pain and stiffness diffusely through the hand.  It is especially painful between the second and third metacarpal heads.  She has been told in the past that she has osteoarthritis.  She has tried Voltaren gel with limited benefit.  She does take some tramadol at night which seems to help.  Her symptoms seem to be worse first thing in the morning.  She also endorses some numbness and tingling into the right hand, specifically the second and third fingers.  She has also noticed an inability to completely flatten her hand on a table.  She brought these concerns to one of the physicians at a recent legislative meeting and that physician recommended that she have this further evaluated.  She does have a history of seropositive rheumatoid arthritis but has never had to have treatment.  She was diagnosed 25 years ago and is followed by rheumatology.  She denies swelling in her hand or wrist.  She denies pain or swelling in the left hand.   Review of Systems    As above Objective:   Physical Exam  Well-developed, well-nourished.  No acute distress.  Awake alert and oriented x3.  Vital signs reviewed.  Right hand: There is some mild hypertrophy of the MCP joint of the right thumb as well as the MCP joint of the second digit.  No effusion.  No soft tissue swelling.  There is some thickening of the fascia in the palmar aspect of the hand leading to the third digit consistent with Dupuytren's contracture.  She is able to make a full fist but does have some slight limitation in full extension when placing her hand flat on a table.  She has a positive Tinel's at the carpal tunnel.  No atrophy.  She has decreased strength secondary to pain.  Good pulses.  X-rays of the right hand show mild diffuse  degenerative changes most pronounced at the MCP joint of the thumb.  No radiographic evidence of rheumatoid arthritis.      Assessment & Plan:   Right hand pain secondary to osteoarthritis Possible mild carpal tunnel syndrome Dupuytren's contracture, mild, right hand  Reassurance that her symptoms are not from rheumatoid arthritis.  Since she has had limited benefit from Voltaren gel I recommended that she try either tumeric or boswella.  These are both available over-the-counter.  She may continue with tramadol at night as needed.  She has a cock-up wrist brace at home and I recommended that she wear this at night to see if that improves her carpal tunnel symptoms.  We discussed the possibility of cortisone injection if symptoms persist.  She will follow-up as needed.

## 2018-09-23 ENCOUNTER — Other Ambulatory Visit: Payer: Self-pay

## 2018-09-23 DIAGNOSIS — M79672 Pain in left foot: Secondary | ICD-10-CM

## 2018-09-24 ENCOUNTER — Ambulatory Visit: Payer: BC Managed Care – PPO | Admitting: Sports Medicine

## 2018-09-24 ENCOUNTER — Other Ambulatory Visit: Payer: Self-pay | Admitting: *Deleted

## 2018-09-24 ENCOUNTER — Encounter: Payer: Self-pay | Admitting: Sports Medicine

## 2018-09-24 ENCOUNTER — Other Ambulatory Visit: Payer: BC Managed Care – PPO | Admitting: Sports Medicine

## 2018-09-24 VITALS — BP 90/60 | Ht 66.5 in | Wt 130.0 lb

## 2018-09-24 DIAGNOSIS — M79672 Pain in left foot: Secondary | ICD-10-CM

## 2018-09-24 DIAGNOSIS — M79675 Pain in left toe(s): Secondary | ICD-10-CM

## 2018-09-24 NOTE — Progress Notes (Signed)
   Subjective:    Patient ID: Wendy Morse, female    DOB: 01-20-1956, 62 y.o.   MRN: [id number]  HPI chief complaint: Left great toe pain  [name] comes in today complaining of 2 days of medial sided left great toe pain.  She tripped awkwardly while walking a dog.  She had significant pain a couple of days ago but it has decreased.  Pain is most noticeable when flexing and extending at the MTP joint.  She has not noticed any swelling.  No bruising.  She denies pain throughout the rest of the toe.  This is the same toe that she suffered an avulsion fracture several years ago.   Review of Systems    As above Objective:   Physical Exam  Well-developed, well-nourished.  No acute distress.  Awake alert and oriented x3.  Vital signs reviewed  Examination of the left foot with attention to the left great toe shows fairly good active and passive range of motion.  She is tender to palpation along the medial aspect of the MTP joint.  No soft tissue swelling.  No ecchymosis.  Good joint stability.  Flexor and extensor tendons are intact.  No tenderness over the sesamoid bones.  Brisk capillary refill.  Patient walks with a slight antalgic gait.  [facility] ultrasound of the left great toe was performed.  Limited images were obtained.  The dorsum of the MTP joint is within normal limits.  There is definite irregularity along the medial aspect of the MTP joint, particularly along the distal first metatarsal.  These findings may be consistent with a new avulsion fracture in this area or may be sequela from her previous injury.      Assessment & Plan:   Left great toe pain with ultrasound evidence of possible new MTP avulsion fracture  I am not sure whether or not the ultrasound findings are consistent with a new injury or or old avulsion fracture.  I am going to go ahead and treat this initially as an acute injury with buddy tape and a first ray post on her custom orthotics.  I also provided her with a  rigid sole plate for her to wear for comfort.  This will limit the motion at the first MTP joint.  Follow-up with me again in 4 weeks for reevaluation and repeat ultrasound.  Call with questions or concerns in the interim.

## 2018-09-25 ENCOUNTER — Other Ambulatory Visit: Payer: BC Managed Care – PPO | Admitting: Sports Medicine

## 2018-11-05 ENCOUNTER — Ambulatory Visit: Payer: BC Managed Care – PPO | Admitting: Sports Medicine

## 2018-11-05 VITALS — BP 104/64 | Ht 66.5 in | Wt 130.0 lb

## 2018-11-05 DIAGNOSIS — M79641 Pain in right hand: Secondary | ICD-10-CM

## 2018-11-05 DIAGNOSIS — M79675 Pain in left toe(s): Secondary | ICD-10-CM

## 2018-11-05 MED ORDER — GABAPENTIN 100 MG PO CAPS: ORAL_CAPSULE | ORAL | 1 refills | Status: DC

## 2018-11-06 NOTE — Progress Notes (Signed)
   Subjective:    Patient ID: Wendy Morse, female    DOB: [birth year], 62 y.o.   MRN: [id number]  HPI   Wendy Morse comes in today for follow-up on an avulsion fracture of her left great toe.  Overall, she is improving.  She still has some pain along the medial aspect of the joint but she is able to ambulate quite easily with her steel shank in her shoe and with her orthotic on top. Main complaint today is right hand discomfort.  She describes a "nerve type pain" across the dorsum of her right hand.  She has had swelling in this hand before as well.  She fractured the scaphoid in this wrist several months ago but denies any wrist pain currently.  She also has a history of cervical spine radiculopathy but denies any symptoms proximal to the dorsum of the hand.    Review of Systems    As above Objective:   Physical Exam Well-developed, well-nourished.  No acute distress.  Awake alert and oriented x3.  Vital signs reviewed.  Neurological exam: Strength is 5/5 in both upper extremities.  Reflexes are trace but equal at the biceps, triceps, and brachioradialis tendons.  Sensation is intact light touch grossly.  No noticeable atrophy.  Right wrist: Full painless range of motion.  No tenderness to palpation, including in the anatomic snuffbox.  No effusion.  Right hand: Patient still has some difficulty with making a complete fist but there is no obvious swelling at the MCP joints.  Good grip strength.       Assessment & Plan:   Dorsal right wrist pain-question neurogenic in nature Improving left great toe pain secondary to recent avulsion fracture and MTP DJD  Although her physical exam is unremarkable today, her history suggests her right hand discomfort to be neuropathic in nature.  She has only had the "nerve pain" for about a week so I would like to start her on a low-dose gabapentin at night (100 mg).  She will also start B6 and follow-up with me either via telephone, email, or in  person in 4 weeks.  If symptoms persist or worsen, consider merits of EMG/nerve conduction study. In regards to her left great toe, she is improving.  She has a normal gait.  She may continue to use the steel shank as needed.  Continue with custom orthotics as well.  She understands that there is arthritis in this joint that may continue to bother her in the future.

## 2019-01-11 ENCOUNTER — Other Ambulatory Visit: Payer: Self-pay

## 2019-01-11 MED ORDER — TRAMADOL HCL 50 MG PO TABS: 50.0000 mg | ORAL_TABLET | Freq: Two times a day (BID) | ORAL | 2 refills | Status: DC | PRN

## 2019-03-03 ENCOUNTER — Encounter: Payer: Self-pay | Admitting: Neurology

## 2019-03-03 ENCOUNTER — Ambulatory Visit: Payer: BC Managed Care – PPO | Admitting: Neurology

## 2019-03-03 ENCOUNTER — Other Ambulatory Visit: Payer: Self-pay

## 2019-03-03 VITALS — BP 102/67 | HR 72 | Ht 66.0 in | Wt 125.0 lb

## 2019-03-03 DIAGNOSIS — M5030 Other cervical disc degeneration, unspecified cervical region: Secondary | ICD-10-CM | POA: Diagnosis not present

## 2019-03-03 DIAGNOSIS — R29898 Other symptoms and signs involving the musculoskeletal system: Secondary | ICD-10-CM

## 2019-03-03 DIAGNOSIS — G629 Polyneuropathy, unspecified: Secondary | ICD-10-CM

## 2019-03-03 DIAGNOSIS — M62549 Muscle wasting and atrophy, not elsewhere classified, unspecified hand: Secondary | ICD-10-CM | POA: Diagnosis not present

## 2019-03-03 NOTE — Progress Notes (Signed)
Subjective:    Patient ID: Wendy Morse is a 63 y.o. female.  HPI     Huston Foley, MD, PhD [facility] [address] [address] [place], [place] [address]  Dear Dr. Kevan Ny,  I saw your patient, Wendy Morse, upon your kind request in my neurologic clinic today for initial consultation of her numbness and paresthesias and weakness affecting both hands and also legs to a lesser degree. The patient is unaccompanied today. As you know, [name] is a 63 year old right-handed woman with an underlying medical history of anxiety, depression,history of right hand fracture, left foot injury, degenerative neck and lower back disease, reflux disease, palpitations, who reports numbness and tingling affecting both upper extremities and also sciatica type symptoms affecting her legs with weakness affecting both hands and both legs for the past months or longer but more noticeable and more significant weakness noted in the past couple of weeks. She had seen orthopedics about 3 months ago for different issues including a prior right hand fracture and left foot injury. She has had some symptoms for years. She had EMG and nerve conduction testing approximately 4 years ago, test results are not available for my review today. She saw a sports medicine doctor at the time and was told that she had peripheral neuropathy. I reviewed your office records, which you kindly included. She is widowed and lives alone. She has no children. She does not smoke, drinks alcohol in the form of spirit, 1-2 daily on average. She stopped cocaine use in 1988 (maybe from 86 to 89 ). She drinks caffeine in the form of diet soda, one a day.  She reports being very athletic usually. She has noted worsening weakness in both hands for the past 2 weeks.  She had a remote EMG with Dr. Sandria Manly years ago, was found to have neuropathy as I understand.  She has seen rheumatology. She had pneumonia last year.  She saw Dr. Corliss Skains last year for arthritis and concern for RA. She had testing for autoimmune d/s even in the mid 80s. she had testing for scleroderma, leprosy (had been to [place]) and Sjogren's. She denies any new headaches, has a reported history of recurrent headaches. She denies any one-sided facial weakness or slurring of speech. She denies any difficulty swallowing or breathing or chewing or speaking. She exercises regularly, tries to pursue a healthy lifestyle. She hydrates with water.She had recent blood work through your office which I was able to review. Blood work was from 02/09/2019. She had a CBC with differential, CMP, CK, sedimentation rate, B12, TSH, ANA, CRP, PTH, HIV. All unrem.  Her Past Surgical History Is Significant For: Past Surgical History:  Procedure Laterality Date  . APPENDECTOMY     Her Family History Is Significant For: No family history on file.  Her Social History Is Significant For: Social History   Socioeconomic History  . Marital status: Widowed    Spouse name: Not on file  . Number of children: Not on file  . Years of education: Not on file  . Highest education level: Not on file  Occupational History  . Not on file  Social Needs  . Financial resource strain: Not on file  . Food insecurity:    Worry: Not on file    Inability: Not on file  . Transportation needs:    Medical: Not on file    Non-medical: Not on file  Tobacco Use  . Smoking status: Never Smoker  .  Smokeless tobacco: Never Used  Substance and Sexual Activity  . Alcohol use: Yes    Frequency: Never  . Drug use: No  . Sexual activity: Not on file  Lifestyle  . Physical activity:    Days per week: Not on file    Minutes per session: Not on file  . Stress: Not on file  Relationships  . Social connections:    Talks on phone: Not on file    Gets together: Not on file    Attends religious service: Not on file    Active member of club or organization: Not on file    Attends  meetings of clubs or organizations: Not on file    Relationship status: Not on file  Other Topics Concern  . Not on file  Social History Narrative  . Not on file    Her Allergies Are:  Allergies  Allergen Reactions  . Bee Venom   . Ciprofloxacin   . Latex   . Penicillins   . Sulfur   :   Her Current Medications Are:  Outpatient Encounter Medications as of 03/03/2019  Medication Sig  . acetaminophen (TYLENOL) 325 MG tablet Take 650 mg by mouth as needed.  . ALPRAZOLAM XR 0.5 MG 24 hr tablet   . Calcium Carb-Cholecalciferol (CALCIUM 600 + D PO) Take by mouth daily.  . diclofenac sodium (VOLTAREN) 1 % GEL Apply 2 g topically 4 (four) times daily.  Marland Kitchen ibuprofen (ADVIL,MOTRIN) 400 MG tablet Take 400 mg by mouth daily.  Marland Kitchen MILK THISTLE PO Take by mouth daily.  . Multiple Vitamins-Minerals (MULTIVITAMIN GUMMIES ADULT PO) Take by mouth daily.  . Omega-3 Fatty Acids (FISH OIL) 1000 MG CAPS Take by mouth daily.  Marland Kitchen OVER THE COUNTER MEDICATION CBD oil  . traMADol (ULTRAM) 50 MG tablet Take 1 tablet (50 mg total) by mouth every 12 (twelve) hours as needed.  Marland Kitchen VIVELLE-DOT 0.025 MG/24HR Place 0.025 patches onto the skin 2 (two) times a week.  . [DISCONTINUED] gabapentin (NEURONTIN) 100 MG capsule Take 1-2 caps at bedtime as needed.   No facility-administered encounter medications on file as of 03/03/2019.   : Review of Systems:  Out of a complete 14 point review of systems, all are reviewed and negative with the exception of these symptoms as listed below: Review of Systems  Neurological:       Pt presents today to discuss her weakness, tingling, and numbness in her hands. Pt thinks she also has sciatica. She is having "problems" with her left leg. Pt reports that she had a NCV/EMG about 4 years ago with a sports med doctor in [place]. 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    Objective:  Neurological Exam  Physical Exam Physical Examination:   Vitals:   03/03/19 1442   BP: 102/67  Pulse: 72    General Examination: The patient is a very pleasant 63 y.o. female in no acute distress. She appears well-developed and well-nourished and well groomed.   HEENT: Normocephalic, atraumatic, pupils are equal, round and reactive to light and accommodation. Extraocular tracking is good without limitation to gaze excursion or nystagmus noted. Normal smooth pursuit is noted. Hearing is grossly intact. Face is symmetric with normal facial animation and normal facial sensation. Speech is clear with no dysarthria noted. There is no hypophonia. There is no lip, neck/head, jaw or voice tremor. Neck is supple with full range of passive and active motion. There are no carotid bruits on  auscultation. Oropharynx exam reveals: mild mouth dryness, adequate dental hygiene. Tongue protrudes centrally and palate elevates symmetrically.   Chest: Clear to auscultation without wheezing, rhonchi or crackles noted.  Heart: S1+S2+0, regular and normal without murmurs, rubs or gallops noted.   Abdomen: Soft, non-tender and non-distended with normal bowel sounds appreciated on auscultation.  Extremities: There is no pitting edema in the distal lower extremities bilaterally. Pedal pulses are intact.  Skin: Warm and dry without trophic changes noted.  Musculoskeletal: exam reveals no obvious joint deformities, tenderness or joint swelling or erythema.   Neurologically:  Mental status: The patient is awake, alert and oriented in all 4 spheres. Her immediate and remote memory, attention, language skills and fund of knowledge are appropriate. There is no evidence of aphasia, agnosia, apraxia or anomia. Speech is clear with normal prosody and enunciation. Thought process is linear. Mood is normal and affect is normal.  Cranial nerves II - XII are as described above under HEENT exam. In addition: shoulder shrug is normal with equal shoulder height noted. Motor exam: thin bulk, strength is normal with  the exception of slight weakness in both hip flexors and intrinsic hand muscle weakness bilaterally. She has no focal atrophy but globally reduced muscle bulk, especially noticeable in both hands. No fasciculations noted in the calf muscles in particular or forearm muscles or biceps areas. Reflexes are 1+ in the upper extremities, perhaps trace in both knees and absent in the ankles. Babinski: Toes are flexor bilaterally. Fine motor skills and coordination: intact with normal finger taps, normal hand movements, normal rapid alternating patting, normal foot taps and normal foot agility.  Cerebellar testing: No dysmetria or intention tremor on finger to nose testing. Heel to shin is unremarkable bilaterally. There is no truncal or gait ataxia.  Sensory exam: intact to light touch, pinprick, vibration, temperature sense proximally, with decreased sensation to all modalities tested in both hands and both lower extremities, up to calf areas bilaterally.  Gait, station and balance: She stands easily. No veering to one side is noted. No leaning to one side is noted. Posture is age-appropriate and stance is narrow based. Gait shows normal stride length and normal pace. No problems turning are noted. She can do tandem walk fairly well. Balance is fairly well preserved.   Assessment and plan:  In summary, Mariany Gessler is a very pleasant 63 y.o.-year old female with an underlying medical history of anxiety, depression,history of right hand fracture, left foot injury, degenerative neck and lower back disease, reflux disease, palpitations, who presents for evaluation of her prior diagnosis of neuropathy but additional weakness and muscle degeneration noticed more recently in the past, particularly in the past couple of weeks which has her concerned. On examination, she does have findings in keeping with neuropathy, this would go along with her long-standing history of neuropathy, perhaps even dating back to the late  80s. She does have a thin muscle bulk overall, no focal atrophy, no telltale fasciculations noted. No abnormal involuntary movements. She does have overall decreased reflex activity. All in all, this could be a resultant of chronic neuropathy which may have progressed over time, even over decades. Nevertheless, an underlying additional neuromuscular problem cannot be fully excluded. She may have a component of radiculopathy in addition, even cervical and lumbar radiculopathy. She had some extensive recent blood work through your office, I added just a few things today. Her overall clinical picture and history is not necessarily alarming for something sinister such as motor neuron  disease. Nevertheless, would like to proceed with an EMG nerve conduction study. I would also like to do a cervical spine MRI, we may add a lumbar spine MRI if needed. She may benefit from seeing a spine specialist again. I suggested for now we proceed with EMG nerve conduction testing, blood work and a cervical spine MRI, we will keep her posted as to her test results by phone call. I also suggested a follow-up appointment after testing. We talked about the importance of healthy lifestyle, staying active physically but she was encouraged not to overdo it. She was advised to rest well, hydrate well, follow good nutrition, also curb if possible her alcohol intake as she does have a tendency to drink every day, albeit not heavily. She is discouraged from drinking alcohol close to bedtime as it can cause sleep disturbance, particularly sleep maintenance difficulties.  I answered all her questions today and the patient was in agreement with the above outlined plan. Thank you very much for allowing me to participate in the care of this nice patient. If I can be of any further assistance to you please do not hesitate to call me at [phone].  Sincerely,   Huston Foley, MD, PhD

## 2019-03-03 NOTE — Patient Instructions (Signed)
You have a history and exam in keeping with neuropathy.  We will check blood work today and call you with the test results.  We will do an EMG and nerve conduction velocity test, which is an electrical nerve and muscle test, which we will schedule. We will call you with the results.  We will do a cervical spine (i.e. neck) MRI to look for degenerative changes and for possible  comparison with your previous neck MRI.

## 2019-03-04 ENCOUNTER — Telehealth: Payer: Self-pay | Admitting: Neurology

## 2019-03-04 NOTE — Telephone Encounter (Signed)
i did ask the scnreeing qustions  MR Cervical spine wo contrast Dr. Alinda Sierras Auth: [id number] (exp. 03/04/19 to 04/02/19). Patient is scheduled for 03/24/19 at [facility].  Patient asked if she could have her MRI and NCV/EMG on the same day because of what is going on right now. I informed her that insurance will not pay for the two things that are done on the same day.. Patient stated she is going to call her insurance and see if this is true or not.

## 2019-03-06 LAB — MULTIPLE MYELOMA PANEL, SERUM
ALBUMIN SERPL ELPH-MCNC: 4.3 g/dL (ref 2.9–4.4)
ALPHA2 GLOB SERPL ELPH-MCNC: 0.6 g/dL (ref 0.4–1.0)
Albumin/Glob SerPl: 1.6 (ref 0.7–1.7)
Alpha 1: 0.2 g/dL (ref 0.0–0.4)
B-GLOBULIN SERPL ELPH-MCNC: 0.9 g/dL (ref 0.7–1.3)
GLOBULIN, TOTAL: 2.8 g/dL (ref 2.2–3.9)
Gamma Glob SerPl Elph-Mcnc: 1.1 g/dL (ref 0.4–1.8)
IgA/Immunoglobulin A, Serum: 137 mg/dL (ref 87–352)
IgG (Immunoglobin G), Serum: 896 mg/dL (ref 700–1600)
IgM (Immunoglobulin M), Srm: 307 mg/dL — ABNORMAL HIGH (ref 26–217)
TOTAL PROTEIN: 7.1 g/dL (ref 6.0–8.5)

## 2019-03-06 LAB — VITAMIN D 25 HYDROXY (VIT D DEFICIENCY, FRACTURES): VIT D 25 HYDROXY: 23.4 ng/mL — AB (ref 30.0–100.0)

## 2019-03-06 LAB — RPR: RPR Ser Ql: NONREACTIVE

## 2019-03-06 LAB — VITAMIN B1: Thiamine: 138.5 nmol/L (ref 66.5–200.0)

## 2019-03-06 LAB — HGB A1C W/O EAG: HEMOGLOBIN A1C: 5.3 % (ref 4.8–5.6)

## 2019-03-06 LAB — VITAMIN B6: VITAMIN B6: 49 ug/L — AB (ref 2.0–32.8)

## 2019-03-09 ENCOUNTER — Telehealth: Payer: Self-pay

## 2019-03-09 NOTE — Telephone Encounter (Signed)
I called pt, had an extended conversation with her regarding her results. Pt is agreeable to an hematology referral. Pt asked that her labs be sent to Dr. Kevan Ny. Pt verbalized understanding of results.  Pt has many questions about if she needs an MRI lumbar spine. She is having problems with her legs. She has questions about her IgM antibody level. She has many questions about her prior CT scans. I recommended that pt have a telephone visit with Dr. Frances Furbish tomorrow at 2:30pm to discuss these questions further and pt is agreeable to this.  Pt understands that although there may be some limitations with this type of visit, we will take all precautions to reduce any security or privacy concerns.  Pt understands that this will be treated like an in office visit and we will file with pt's insurance, and there may be a patient responsible charge related to this service.   Please call pt at [phone] at 2:30 on 03/10/2019.

## 2019-03-09 NOTE — Progress Notes (Signed)
Her labs show a mildly reduced vitamin D level, I would recommend an over-the-counter vitamin D supplement, 1000-2000 units by mouth over-the-counter daily would be okay, she can have her vitamin D level rechecked by her primary care physician in about 3 months or so. In addition, her vitamin B6 level is elevated, I would recommend that she not take any additional vitamin B6 if she is taking a multivitamin or B complex vitamin or B6 separately. Her multiple myeloma panel which is the serum protein breakdown and antibody content of her blood shows a mild elevation of the IgM antibodies, this is a nonspecific finding, but if she is agreeable, we can seek input from hematology on this. Sometimes certain elevated antibodies can cause neuropathy. This would be a separate referral to hematology specialty physician, we can certainly put the referral in, if she agrees.  Please call pt to update her regarding her blood test results and inquire about the hematology referral. Michel Bickers

## 2019-03-09 NOTE — Telephone Encounter (Signed)
-----  Message from Star Age, MD sent at 03/09/2019  1:13 PM EDT ----- Her labs show a mildly reduced vitamin D level, I would recommend an over-the-counter vitamin D supplement, 1000-2000 units by mouth over-the-counter daily would be okay, she can have her vitamin D level rechecked by her primary care physician in about 3 months or so. In addition, her vitamin B6 level is elevated, I would recommend that she not take any additional vitamin B6 if she is taking a multivitamin or B complex vitamin or B6 separately. Her multiple myeloma panel which is the serum protein breakdown and antibody content of her blood shows a mild elevation of the IgM antibodies, this is a nonspecific finding, but if she is agreeable, we can seek input from hematology on this. Sometimes certain elevated antibodies can cause neuropathy. This would be a separate referral to hematology specialty physician, we can certainly put the referral in, if she agrees.  Please call pt to update her regarding her blood test results and inquire about the hematology referral. Michel Bickers

## 2019-03-10 ENCOUNTER — Telehealth: Payer: Self-pay | Admitting: Neurology

## 2019-03-10 ENCOUNTER — Ambulatory Visit: Payer: BC Managed Care – PPO | Admitting: Neurology

## 2019-03-10 ENCOUNTER — Ambulatory Visit
Admission: RE | Admit: 2019-03-10 | Discharge: 2019-03-10 | Disposition: A | Discharge status: Home / Self Care | Payer: BC Managed Care – PPO | Source: Ambulatory Visit | Attending: Internal Medicine | Admitting: Internal Medicine

## 2019-03-10 ENCOUNTER — Other Ambulatory Visit: Payer: Self-pay

## 2019-03-10 ENCOUNTER — Other Ambulatory Visit: Payer: Self-pay | Admitting: Internal Medicine

## 2019-03-10 DIAGNOSIS — R634 Abnormal weight loss: Secondary | ICD-10-CM

## 2019-03-10 NOTE — Telephone Encounter (Signed)
Pt states that her PCP scheduled her an appt around the time of her appt here and would like to know if her telephone visit can be pushed back or to another day. Please advise.

## 2019-03-10 NOTE — Telephone Encounter (Signed)
Pt states her PCP Dr. Kevan Ny is needing a copy of her lab results. Please fax to [phone].

## 2019-03-10 NOTE — Telephone Encounter (Signed)
I called pt. She reports that Dr. Kevan Ny asked to her come in and she will be in that appt during the telephone visit with Dr. Frances Furbish. I offered pt a new telephone visit appt with Dr. Frances Furbish on 03/11/19 at 11:30am. Pt accepted this appt.

## 2019-03-11 ENCOUNTER — Ambulatory Visit (INDEPENDENT_AMBULATORY_CARE_PROVIDER_SITE_OTHER): Payer: BC Managed Care – PPO | Admitting: Neurology

## 2019-03-11 ENCOUNTER — Other Ambulatory Visit: Payer: Self-pay

## 2019-03-11 DIAGNOSIS — M5030 Other cervical disc degeneration, unspecified cervical region: Secondary | ICD-10-CM | POA: Diagnosis not present

## 2019-03-11 DIAGNOSIS — E559 Vitamin D deficiency, unspecified: Secondary | ICD-10-CM

## 2019-03-11 DIAGNOSIS — R29898 Other symptoms and signs involving the musculoskeletal system: Secondary | ICD-10-CM

## 2019-03-11 DIAGNOSIS — G629 Polyneuropathy, unspecified: Secondary | ICD-10-CM | POA: Diagnosis not present

## 2019-03-11 NOTE — Patient Instructions (Signed)
Given over the phone during today's phone call virtual visit.  

## 2019-03-11 NOTE — Progress Notes (Signed)
[name] is a 63 year old right-handed woman with an underlying medical history of anxiety, depression,history of right hand fracture, left foot injury, degenerative neck and lower back disease, reflux disease, palpitations, with whom I am conducting a phone visit today (see below). We are discussing her leg weakness, recent test results and further diagnostic test options. The patient is unaccompanied today. I first met her on 03/03/2019 at the request of her primary care physician, Dr. Josetta Morse, at which time the patient reported a longer standing history of numbness and tingling affecting both upper and lower extremities as well as a long-standing history of sciatica type symptoms affecting both legs and weakness affecting proximal legs. Symptoms had worsened over the previous 3 months.   I suggested we proceed with diagnostic testing in the form of EMG and nerve conduction velocity testing, cervical spine MRI, and some additional labs, she had recent labs with her PCP as well which I reviewed. We checked vitamin B6, RPR, B1, multiple myeloma panel, vitamin D and A1c. 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 We called her with her test results.  She had an interim appointment with her primary care physician on 03/10/2019. She is scheduled for cervical spine MRI on 03/24/2019, she is scheduled for EMG and nerve conduction testing in our office on 03/25/2019.  I reviewed her medical chart, she had prior head CTs.  She had a CTH without contrast on 09/22/2017 which showed: IMPRESSION: No acute intracranial abnormality.   Stable heterogeneous mineralization throughout the calvarium.  She had a head CT without contrast on 03/28/2015 which showed: IMPRESSION: 1. Heterogeneous bone mineralization of the calvarium with numerous small lucent areas. This could be benign, but recommend correlation for  multiple myeloma (e.g. Follow-up protein electrophoresis). 2.  Normal noncontrast CT appearance of the brain. ADDENDUM: A previous study is been made available for comparison from [facility] dated 09/24/2010.   The heterogeneous bone mineralization at the vertex described currently is unchanged since 2011, and therefore is benign. Subsequently, there is no acute or suspicious osseous abnormality identified.   CONCLUSION:   Unchanged since 2011 and therefore BENIGN bone mineralization at the skull vertex.   She had a lumbar spine MRI without contrast on 11/06/2014 and I reviewed the results: IMPRESSION: 1. Lumbar spondylosis and degenerative disc disease without overt impingement. Dominant findings are at the L4-5 level where there is considerable facet arthropathy along with grade 1 degenerative anterolisthesis.  Today, 03/11/2019: Please see below for phone call visit.  The patient's allergies, current medications, family history, past medical history, past social history, past surgical history and problem list were reviewed and updated as appropriate.    Previously:  03/03/2019: (She) reports numbness and tingling affecting both upper extremities and also sciatica type symptoms affecting her legs with weakness affecting both hands and both legs for the past months or longer but more noticeable and more significant weakness noted in the past couple of weeks. She had seen orthopedics about 3 months ago for different issues including a prior right hand fracture and left foot injury. She has had some symptoms for years. She had EMG and nerve conduction testing approximately 4 years ago, test results are not available for my review today. She saw a sports medicine doctor at the time and was told that she had peripheral neuropathy. I reviewed your office records, which you kindly included. She is widowed and lives alone. She has no  children. She does not smoke, drinks alcohol in the form  of spirit, 1-2 daily on average. She stopped cocaine use in 1988 (maybe from 86 to 89 ). She drinks caffeine in the form of diet soda, one a day.  She reports being very athletic usually. She has noted worsening weakness in both hands for the past 2 weeks.  She had a remote EMG with Dr. Erling Morse years ago, was found to have neuropathy as I understand.  She has seen rheumatology. She had pneumonia last year. She saw Dr. Estanislado Morse last year for arthritis and concern for RA. She had testing for autoimmune d/s even in the mid 80s. she had testing for scleroderma, leprosy (had been to [place]) and Sjogren's. She denies any new headaches, has a reported history of recurrent headaches. She denies any one-sided facial weakness or slurring of speech. She denies any difficulty swallowing or breathing or chewing or speaking. She exercises regularly, tries to pursue a healthy lifestyle. She hydrates with water.She had recent blood work through your office which I was able to review. Blood work was from 02/09/2019. She had a CBC with differential, CMP, CK, sedimentation rate, B12, TSH, ANA, CRP, PTH, HIV. All unrem.  Virtual Visit via Telephone Note on 03/11/2019:   I connected with Wendy Morse on 03/11/19 at 11:30 AM EDT by telephone and verified that I am speaking with the correct person using two identifiers.   I discussed the limitations, risks, security and privacy concerns of performing an evaluation and management service by telephone and the availability of in person appointments. I also discussed with the patient that there may be a patient responsible charge related to this service. The patient expressed understanding and agreed to proceed.  History of Present Illness:  She reports that she had an office visit with her primary care physician yesterday. She had additional blood work. He also verbally consulted as I understand with a hematology colleagues who encouraged waiting for additional blood test  results. She was reassured about the minor abnormality on her protein electrophoresis as I understand. 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 She has been scheduled for cervical spine MRI and EMG testing she endorses. She endorses fatigue, exercise intolerance and decreased physical stamina. She has noticed some night sweats. She reports being up-to-date with screening tests such as mammography and colonoscopy. She reports a remote history of carpal monoxide poisoning, she also reports that she was at some point in the 80s suspected to have a connective tissue disease.   Observations/Objective:  She reports stress, she has slightly pressured speech but other than that she has no obvious signs of distress over the phone. She has no dysarthria. She is able to provide her own history.  Assessment and Plan:  In summary, Wendy Morse is a very pleasant 63 year old female with an underlying medical history of anxiety, depression, history of right hand fracture, left foot injury, degenerative neck and lower back disease, reflux disease, palpitations, who presents for phone visit FU consultation of her neuropathy but additional weakness and muscle degeneration noticed more recently in the past, particularly in the past weeks which has her concerned. Blood work from PCP was reassuring, additional blood testing from 03/03/19 showed mildly low vit D, elevated B6, and mildly abnormal IFE with polyclonal increase. She has started vit D OTC, stopped taking her vitamin with B6 in it. She has seen her PCP yesterday, who added additional blood  tests. we will proceed with EMG/nerve conduction testing, and a cervical spine MRI, we will keep her posted as to her test results by phone call. She has a follow-up appointment in June.   Follow Up Instructions:  Proceed with EMG and nerve conduction study, proceed with cervical spine MRI. Consider  hematology referral. Consider lumbar spine MRI. Follow-up as scheduled in June 2020, sooner if needed.   I discussed the assessment and treatment plan with the patient. The patient was provided an opportunity to ask questions and all were answered. The patient agreed with the plan and demonstrated an understanding of the instructions.   The patient was advised to call back or seek an in-person evaluation if the symptoms worsen or if the condition fails to improve as anticipated.  I provided 16 minutes of non-face-to-face time during this encounter.   Star Age, MD

## 2019-03-17 NOTE — Telephone Encounter (Signed)
I called the patient to discuss MRI being canceled due to COVID-19. She did not answer so I left a VM asking her to call me back.

## 2019-03-18 NOTE — Telephone Encounter (Signed)
I left a voicemail informing the patient that due to the Covid-19 we are not having MRI's in the office for the time being she was scheduled for 03/24/19 in our office. [facility] is still having MRI's and I stated that I would send the order there. I also left their number of [phone] and to give them a call if she has not heard from them in the next couple days.   When you get a chance can you put a new MRI order in for [facility] because since it was scheduled in our office the order will need a new accession number for [facility] to be able to schedule it.

## 2019-03-22 NOTE — Addendum Note (Signed)
Addended by: Huston Foley on: 03/22/2019 08:11 AM   Modules accepted: Orders

## 2019-03-22 NOTE — Telephone Encounter (Signed)
I placed another order for MRI c spine for [facility], as an addendum to office encounter.

## 2019-03-22 NOTE — Telephone Encounter (Signed)
Noted, thank you

## 2019-03-24 ENCOUNTER — Other Ambulatory Visit: Payer: BC Managed Care – PPO

## 2019-03-25 ENCOUNTER — Encounter

## 2019-03-25 ENCOUNTER — Encounter: Payer: BC Managed Care – PPO | Admitting: Diagnostic Neuroimaging

## 2019-05-13 ENCOUNTER — Encounter: Payer: Self-pay | Admitting: Diagnostic Neuroimaging

## 2019-05-18 ENCOUNTER — Other Ambulatory Visit: Payer: BC Managed Care – PPO

## 2019-05-20 ENCOUNTER — Ambulatory Visit: Payer: BC Managed Care – PPO | Admitting: Diagnostic Neuroimaging

## 2019-05-20 ENCOUNTER — Encounter (INDEPENDENT_AMBULATORY_CARE_PROVIDER_SITE_OTHER): Payer: BC Managed Care – PPO | Admitting: Diagnostic Neuroimaging

## 2019-05-20 ENCOUNTER — Other Ambulatory Visit: Payer: Self-pay

## 2019-05-20 DIAGNOSIS — Z0289 Encounter for other administrative examinations: Secondary | ICD-10-CM

## 2019-05-20 DIAGNOSIS — G629 Polyneuropathy, unspecified: Secondary | ICD-10-CM

## 2019-05-22 ENCOUNTER — Other Ambulatory Visit: Payer: Self-pay

## 2019-05-22 ENCOUNTER — Ambulatory Visit
Admission: RE | Admit: 2019-05-22 | Discharge: 2019-05-22 | Disposition: A | Discharge status: Home / Self Care | Payer: BC Managed Care – PPO | Source: Ambulatory Visit | Attending: Neurology | Admitting: Neurology

## 2019-05-22 DIAGNOSIS — M5030 Other cervical disc degeneration, unspecified cervical region: Secondary | ICD-10-CM

## 2019-05-22 DIAGNOSIS — M62549 Muscle wasting and atrophy, not elsewhere classified, unspecified hand: Secondary | ICD-10-CM

## 2019-05-22 DIAGNOSIS — R29898 Other symptoms and signs involving the musculoskeletal system: Secondary | ICD-10-CM

## 2019-05-22 DIAGNOSIS — G629 Polyneuropathy, unspecified: Secondary | ICD-10-CM

## 2019-05-24 ENCOUNTER — Telehealth: Payer: Self-pay

## 2019-05-24 NOTE — Telephone Encounter (Signed)
I called pt, discussed her MRI C-spine results. Pt will keep her 06/03/19 appt, she has further questions about her numbness/tingling. Pt verbalized understanding of results. Pt had no questions about her MRI c-spine at this time but was encouraged to call back if questions arise.

## 2019-05-24 NOTE — Telephone Encounter (Signed)
-----   Message from Star Age, MD sent at 05/24/2019  7:57 AM EDT ----- C-spine MRI wo contrast showed only mild degenerative changes. Nothing sinister, thankfully. In particular, no evidence of spinal cord changes or pinched nerve roots.  Overall very reassuring. Please update pt. Wendy Morse

## 2019-05-24 NOTE — Progress Notes (Signed)
C-spine MRI wo contrast showed only mild degenerative changes. Nothing sinister, thankfully. In particular, no evidence of spinal cord changes or pinched nerve roots.  Overall very reassuring. Please update pt. Wendy Morse

## 2019-05-27 NOTE — Procedures (Signed)
[facility]  NCS (NERVE CONDUCTION STUDY) WITH EMG (ELECTROMYOGRAPHY) REPORT   STUDY DATE: 05/20/19  PATIENT NAME: Wendy Morse DOB: [birth year] MRN: [id number]  ORDERING CLINICIAN: Huston FoleySaima Athar, MD PhD   TECHNOLOGIST: Durenda AgeN Gann ELECTROMYOGRAPHER: Glenford BayleyVikram R. Ameia Morency, MD  CLINICAL INFORMATION: 63 year old female here for evaluation of neuropathy and muscle degeneration.  FINDINGS: NERVE CONDUCTION STUDY:  Right median, right ulnar, bilateral peroneal and bilateral tibial motor responses are normal.  Right median, right ulnar, bilateral sural and bilateral superficial peroneal sensory responses are normal.  Right median to ulnar transcarpal mixed nerve comparison is normal.  Bilateral tibial and right ulnar F-wave latencies are normal.   NEEDLE ELECTROMYOGRAPHY:  Needle examination of right upper and right lower extremities is normal.   IMPRESSION:   This is a normal study.  No electrodiagnostic evidence of large fiber neuropathy, myopathy or polyradiculopathy at this time.    INTERPRETING PHYSICIAN:  Suanne MarkerVIKRAM R. Bayard More, MD Certified in Neurology, Neurophysiology and Neuroimaging  [facility] [address], [address] [place], [place] [id number] [phone]   [facility]    Nerve / Sites Muscle Latency Ref. Amplitude Ref. Rel Amp Segments Distance Velocity Ref. Area    ms ms mV mV %  cm m/s m/s mVms  R Median - APB     Wrist APB 2.9 ?4.4 4.4 ?4.0 100 Wrist - APB 7   15.3     Upper arm APB 6.9  4.5  101 Upper arm - Wrist 23 57 ?49 15.6  R Ulnar - ADM     Wrist ADM 2.2 ?3.3 8.9 ?6.0 100 Wrist - ADM 7   23.9     B.Elbow ADM 5.5  7.5  84.9 B.Elbow - Wrist 19 59 ?49 21.9     A.Elbow ADM 7.3  6.9  91.1 A.Elbow - B.Elbow 10 55 ?49 20.2         A.Elbow - Wrist      R Peroneal - EDB     Ankle EDB 4.7 ?6.5 5.9 ?2.0 100 Ankle - EDB 9   15.4     Fib head EDB 11.5  6.0  102 Fib head - Ankle 31 45 ?44 15.8     Pop fossa EDB 13.8  5.9   97.7 Pop fossa - Fib head 10 45 ?44 15.2         Pop fossa - Ankle      L Peroneal - EDB     Ankle EDB 5.3 ?6.5 5.0 ?2.0 100 Ankle - EDB 9   16.1     Fib head EDB 11.8  4.6  93.4 Fib head - Ankle 30 46 ?44 15.7     Pop fossa EDB 14.0  4.2  90.1 Pop fossa - Fib head 10 46 ?44 14.3         Pop fossa - Ankle      R Tibial - AH     Ankle AH 4.1 ?5.8 10.3 ?4.0 100 Ankle - AH 9   20.6     Pop fossa AH 13.1  8.1  78.4 Pop fossa - Ankle 40 44 ?41 17.4  L Tibial - AH     Ankle AH 3.8 ?5.8 7.1 ?4.0 100 Ankle - AH 9   19.4     Pop fossa AH 13.1  6.2  86.4 Pop fossa - Ankle 40 43 ?41 17.8  [place]    Nerve / Sites Rec. Site Peak Lat Ref.  Amp Ref. Segments Distance Peak Diff Ref.    ms ms V V  cm ms ms  R Sural - Ankle (Calf)     Calf Ankle 3.2 ?4.4 10 ?6 Calf - Ankle 14    L Sural - Ankle (Calf)     Calf Ankle 3.6 ?4.4 6 ?6 Calf - Ankle 14    R Superficial peroneal - Ankle     Lat leg Ankle 3.5 ?4.4 6 ?6 Lat leg - Ankle 14    L Superficial peroneal - Ankle     Lat leg Ankle 3.9 ?4.4 5 ?6 Lat leg - Ankle 14    R Median, Ulnar - Transcarpal comparison     Median Palm Wrist 2.1 ?2.2 77 ?35 Median Palm - Wrist 8       Ulnar Palm Wrist 2.1 ?2.2 30 ?12 Ulnar Palm - Wrist 8          Median Palm - Ulnar Palm  0.0 ?0.4  R Median - Orthodromic (Dig II, Mid palm)     Dig II Wrist 3.0 ?3.4 18 ?10 Dig II - Wrist 13    R Ulnar - Orthodromic, (Dig V, Mid palm)     Dig V Wrist 2.9 ?3.1 9 ?5 Dig V - Wrist 20                     F  Wave    Nerve F Lat Ref.   ms ms  R Tibial - AH 52.6 ?56.0  R Ulnar - ADM 27.0 ?32.0  L Tibial - AH 52.9 ?56.0           EMG full       EMG Summary Table    Spontaneous MUAP Recruitment  Muscle IA Fib PSW Fasc Other Amp Dur. Poly Pattern  L. Deltoid Normal None None None _______ Normal Normal Normal Normal  L. Biceps brachii Normal None None None _______ Normal Normal Normal Normal  L. Triceps brachii Normal None None None _______ Normal Normal Normal  Normal  L. Flexor carpi radialis Normal None None None _______ Normal Normal Normal Normal  L. First dorsal interosseous Normal None None None _______ Normal Normal Normal Normal  L. Tibialis anterior Normal None None None _______ Normal Normal Normal Normal  L. Gastrocnemius (Medial head) Normal None None None _______ Normal Normal Normal Normal

## 2019-05-28 ENCOUNTER — Other Ambulatory Visit: Payer: Self-pay | Admitting: Nurse Practitioner

## 2019-05-28 DIAGNOSIS — Z1231 Encounter for screening mammogram for malignant neoplasm of breast: Secondary | ICD-10-CM

## 2019-05-29 ENCOUNTER — Ambulatory Visit
Admission: RE | Admit: 2019-05-29 | Discharge: 2019-05-29 | Disposition: A | Discharge status: Home / Self Care | Payer: BC Managed Care – PPO | Source: Ambulatory Visit | Attending: Nurse Practitioner | Admitting: Nurse Practitioner

## 2019-05-29 ENCOUNTER — Other Ambulatory Visit: Payer: Self-pay

## 2019-05-29 DIAGNOSIS — Z1231 Encounter for screening mammogram for malignant neoplasm of breast: Secondary | ICD-10-CM

## 2019-05-31 ENCOUNTER — Telehealth: Payer: Self-pay

## 2019-05-31 NOTE — Telephone Encounter (Signed)
-----   Message from Star Age, MD sent at 05/31/2019  7:42 AM EDT ----- Please call and advise the patient that the recent EMG and nerve conduction velocity test, which is the electrical nerve and muscle test we we performed, was reported as within normal limits. We checked for abnormal electrical discharges in the muscles or nerves and the report suggested normal findings. No further action is required on this test at this time. At this juncture, given her lab results, C spine MRI results and EMG results, I don't think we are missing any serious underlying neurological condition. She can FU with PCP. I would be happy to see her as needed.   Star Age, MD, PhD

## 2019-05-31 NOTE — Progress Notes (Signed)
Please call and advise the patient that the recent EMG and nerve conduction velocity test, which is the electrical nerve and muscle test we we performed, was reported as within normal limits. We checked for abnormal electrical discharges in the muscles or nerves and the report suggested normal findings. No further action is required on this test at this time. At this juncture, given her lab results, C spine MRI results and EMG results, I don't think we are missing any serious underlying neurological condition. She can FU with PCP. I would be happy to see her as needed.   Star Age, MD, PhD

## 2019-05-31 NOTE — Telephone Encounter (Signed)
I called pt, discussed her EMG/NCV results. Pt reports that she still has numbness/tingling in her hands, and pain in her neck. She would really rather keep her appt tomorrow and does not want to cancel it. Pt verbalized understanding of results. Pt had no questions at this time but was encouraged to call back if questions arise.

## 2019-06-01 ENCOUNTER — Other Ambulatory Visit: Payer: Self-pay

## 2019-06-01 ENCOUNTER — Encounter: Payer: Self-pay | Admitting: Neurology

## 2019-06-01 ENCOUNTER — Ambulatory Visit: Payer: BC Managed Care – PPO | Admitting: Neurology

## 2019-06-01 VITALS — BP 114/65 | HR 59 | Temp 97.7°F | Ht 66.0 in | Wt 127.0 lb

## 2019-06-01 DIAGNOSIS — R51 Headache: Secondary | ICD-10-CM | POA: Diagnosis not present

## 2019-06-01 DIAGNOSIS — R29898 Other symptoms and signs involving the musculoskeletal system: Secondary | ICD-10-CM | POA: Diagnosis not present

## 2019-06-01 DIAGNOSIS — G4489 Other headache syndrome: Secondary | ICD-10-CM

## 2019-06-01 DIAGNOSIS — R519 Headache, unspecified: Secondary | ICD-10-CM

## 2019-06-01 DIAGNOSIS — R202 Paresthesia of skin: Secondary | ICD-10-CM

## 2019-06-01 DIAGNOSIS — R200 Anesthesia of skin: Secondary | ICD-10-CM

## 2019-06-01 DIAGNOSIS — M62549 Muscle wasting and atrophy, not elsewhere classified, unspecified hand: Secondary | ICD-10-CM | POA: Diagnosis not present

## 2019-06-01 DIAGNOSIS — G500 Trigeminal neuralgia: Secondary | ICD-10-CM

## 2019-06-01 MED ORDER — CARBAMAZEPINE 200 MG PO TABS: ORAL_TABLET | ORAL | 3 refills | Status: AC

## 2019-06-01 NOTE — Patient Instructions (Addendum)
As discussed, we will try low dose Tegretol (carbamazepine) 200 mg strength: take 1 pill each bedtime for 1 week, then 1 pill twice daily thereafter.  Side effects include sleepiness, daytime grogginess, balance problems, dizziness. Carbamazepine can lower your white cell count and your sodium level, we will do blood work in 1 month and to follow-up with the nurse practitioner in 1 month. As discussed, we will seek a second opinion regarding your numbness and tingling and weakness with a neuromuscular specialist at [facility].  I will place a referral. We will do a brain scan, called MRI to look Specifically at the trigeminal area to investigate your left facial pain. We will call you with the test results. We will have to schedule you for this on a separate date. This test requires authorization from your insurance, and we will take care of the insurance process.

## 2019-06-01 NOTE — Progress Notes (Signed)
Subjective:    Patient ID: Wendy Morse is a 63 y.o. female.  HPI     Interim history:   Wendy Morse is a 63 year old right-handed woman with an underlying medical history of anxiety, depression,history of right hand fracture, left foot injury, degenerative neck and lower back disease, reflux disease, palpitations,who presents for follow-up consultation of her numbness and tingling and leg weakness.  The patient is unaccompanied today.  I last had a virtual visit with her via phone call on 03/11/2019 at which time we talked about her recent test results.  I suggested we proceed with an EMG nerve conduction test.  We talked about potentially doing an MRI of the lumbar spine and a hematology referral as she had mild abnormalities on the SPEP.  She was advised to proceed with a cervical spine MRI.  She did have a recent visit with her PCP and for the blood work and she also reported that her PCP talked to a hematologist.  She had a C-spine MRI without contrast on 05/22/2019 and I reviewed the results: IMPRESSION: Slightly abnormal MRI scan cervical spine showing mild abnormal curvature and facet degenerative changes but no frank disc herniation cord compression no significant narrowing.   We called her with her test results.. She had an EMG and nerve conduction study on 05/20/2019 of the right upper and lower extremities and I reviewed the results: IMPRESSION:    This is a normal study.  No electrodiagnostic evidence of large fiber neuropathy, myopathy or polyradiculopathy at this time.  We called her with her test results.    Today, 06/01/2019:   She reports ongoing numbness and tingling in both upper and lower extremities, she has neck pain, particularly on the left side.  In the past several weeks she has had intermittent sharp facial pain in the preauricular area on the left side, sometimes radiating up to the temple.  She is wondering if she has trigeminal neuralgia.  Of note, she has  tried gabapentin for her sciatica and had some side effects with it.  In the remote past she was on Dilantin for seizures as I understand.  She had a rash from it and stopped it after a couple of weeks.  She is allergic to it. Her weakness is about the same.  She has not seen a spine specialist.  Her last follow-up with her primary care physician was in late March after our ph visit.   The patient's allergies, current medications, family history, past medical history, past social history, past surgical history and problem list were reviewed and updated as appropriate.    Previously:   I first met her on 03/03/2019 at the request of her primary care physician, Dr. Josetta Huddle, at which time the patient reported a longer standing history of numbness and tingling affecting both upper and lower extremities as well as a long-standing history of sciatica type symptoms affecting both legs and weakness affecting proximal legs. Symptoms had worsened over the previous 3 months.    I suggested we proceed with diagnostic testing in the form of EMG and nerve conduction velocity testing, cervical spine MRI, and some additional labs, she had recent labs with her PCP as well which I reviewed. We checked vitamin B6, RPR, B1, multiple myeloma panel, vitamin D and A1c. 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 We called her with her test results.  She had an interim appointment  with her primary care physician on 03/10/2019. She is scheduled for cervical spine MRI on 03/24/2019, she is scheduled for EMG and nerve conduction testing in our office on 03/25/2019.   I reviewed her medical chart, she had prior head CTs.  She had a CTH without contrast on 09/22/2017 which showed: IMPRESSION: No acute intracranial abnormality.   Stable heterogeneous mineralization throughout the calvarium.   She had a head CT without  contrast on 03/28/2015 which showed: IMPRESSION: 1. Heterogeneous bone mineralization of the calvarium with numerous small lucent areas. This could be benign, but recommend correlation for multiple myeloma (e.g. Follow-up protein electrophoresis). 2.  Normal noncontrast CT appearance of the brain. ADDENDUM: A previous study is been made available for comparison from [facility] dated 09/24/2010.   The heterogeneous bone mineralization at the vertex described currently is unchanged since 2011, and therefore is benign. Subsequently, there is no acute or suspicious osseous abnormality identified.   CONCLUSION:   Unchanged since 2011 and therefore BENIGN bone mineralization at the skull vertex.    She had a lumbar spine MRI without contrast on 11/06/2014 and I reviewed the results: IMPRESSION: 1. Lumbar spondylosis and degenerative disc disease without overt impingement. Dominant findings are at the L4-5 level where there is considerable facet arthropathy along with grade 1 degenerative anterolisthesis.     03/03/2019: (She) reports numbness and tingling affecting both upper extremities and also sciatica type symptoms affecting her legs with weakness affecting both hands and both legs for the past months or longer but more noticeable and more significant weakness noted in the past couple of weeks. She had seen orthopedics about 3 months ago for different issues including a prior right hand fracture and left foot injury. She has had some symptoms for years. She had EMG and nerve conduction testing approximately 4 years ago, test results are not available for my review today. She saw a sports medicine doctor at the time and was told that she had peripheral neuropathy. I reviewed your office records, which you kindly included. She is widowed and lives alone. She has no children. She does not smoke, drinks alcohol in the form of spirit, 1-2 daily on average. She stopped cocaine use in 1988  (maybe from 86 to 89 ). She drinks caffeine in the form of diet soda, one a day.  She reports being very athletic usually. She has noted worsening weakness in both hands for the past 2 weeks.  She had a remote EMG with Dr. Erling Cruz years ago, was found to have neuropathy as I understand.  She has seen rheumatology. She had pneumonia last year. She saw Dr. Estanislado Pandy last year for arthritis and concern for RA. She had testing for autoimmune d/s even in the mid 80s. she had testing for scleroderma, leprosy (had been to [place]) and Sjogren's. She denies any new headaches, has a reported history of recurrent headaches. She denies any one-sided facial weakness or slurring of speech. She denies any difficulty swallowing or breathing or chewing or speaking. She exercises regularly, tries to pursue a healthy lifestyle. She hydrates with water.She had recent blood work through your office which I was able to review. Blood work was from 02/09/2019. She had a CBC with differential, CMP, CK, sedimentation rate, B12, TSH, ANA, CRP, PTH, HIV. All unrem.   Her Past Medical History Is Significant For: History reviewed. No pertinent past medical history.  Her Past Surgical History Is Significant For: Past Surgical History:  Procedure Laterality Date  . APPENDECTOMY  Her Family History Is Significant For: History reviewed. No pertinent family history.  Her Social History Is Significant For: Social History   Socioeconomic History  . Marital status: Widowed    Spouse name: Not on file  . Number of children: Not on file  . Years of education: Not on file  . Highest education level: Not on file  Occupational History  . Not on file  Social Needs  . Financial resource strain: Not on file  . Food insecurity    Worry: Not on file    Inability: Not on file  . Transportation needs    Medical: Not on file    Non-medical: Not on file  Tobacco Use  . Smoking status: Never Smoker  . Smokeless tobacco: Never  Used  Substance and Sexual Activity  . Alcohol use: Yes    Frequency: Never  . Drug use: No  . Sexual activity: Not on file  Lifestyle  . Physical activity    Days per week: Not on file    Minutes per session: Not on file  . Stress: Not on file  Relationships  . Social Herbalist on phone: Not on file    Gets together: Not on file    Attends religious service: Not on file    Active member of club or organization: Not on file    Attends meetings of clubs or organizations: Not on file    Relationship status: Not on file  Other Topics Concern  . Not on file  Social History Narrative  . Not on file    Her Allergies Are:  Allergies  Allergen Reactions  . Bee Venom   . Ciprofloxacin   . Latex   . Penicillins   . Sulfur   :   Her Current Medications Are:  Outpatient Encounter Medications as of 06/01/2019  Medication Sig  . acetaminophen (TYLENOL) 325 MG tablet Take 650 mg by mouth as needed.  . ALPRAZOLAM XR 0.5 MG 24 hr tablet   . Calcium Carb-Cholecalciferol (CALCIUM 600 + D PO) Take by mouth daily.  . diclofenac sodium (VOLTAREN) 1 % GEL Apply 2 g topically 4 (four) times daily.  Marland Kitchen ibuprofen (ADVIL,MOTRIN) 400 MG tablet Take 400 mg by mouth daily.  Marland Kitchen MILK THISTLE PO Take by mouth daily.  . Multiple Vitamins-Minerals (MULTIVITAMIN GUMMIES ADULT PO) Take by mouth daily.  . Omega-3 Fatty Acids (FISH OIL) 1000 MG CAPS Take by mouth daily.  Marland Kitchen OVER THE COUNTER MEDICATION CBD oil  . traMADol (ULTRAM) 50 MG tablet Take 1 tablet (50 mg total) by mouth every 12 (twelve) hours as needed.  Marland Kitchen VIVELLE-DOT 0.025 MG/24HR Place 0.025 patches onto the skin 2 (two) times a week.  . carbamazepine (TEGRETOL) 200 MG tablet 1 pill each bedtime for 1 week, then 1 pill twice daily thereafter.   No facility-administered encounter medications on file as of 06/01/2019.   :  Review of Systems:  Out of a complete 14 point review of systems, all are reviewed and negative with the  exception of these symptoms as listed below: Review of Systems  Neurological:       Pt presents today with further questions about her numbness and tingling.    Objective:  Neurological Exam  Physical Exam Physical Examination:   Vitals:   06/01/19 0913  BP: 114/65  Pulse: (!) 59  Temp: 97.7 F (36.5 C)   General Examination: The patient is a very pleasant 63 y.o. female in no  acute distress. She appears well-developed and well-nourished and well groomed.   HEENT: Normocephalic, atraumatic, pupils are equal, round and reactive to light and accommodation. Extraocular tracking is good without limitation to gaze excursion or nystagmus noted. Normal smooth pursuit is noted. Hearing is grossly intact. Face is symmetric with normal facial animation. Speech is clear with no dysarthria noted, maybe slightly pressured. There is no hypophonia. There is no lip, neck/head, jaw or voice tremor. Neck is supple with full range of passive and active motion. There are no carotid bruits on auscultation. Oropharynx exam reveals: mild mouth dryness, adequate dental hygiene. Tongue protrudes centrally and palate elevates symmetrically, no Abnormal involuntary orofacial movements, tongue movements are good in all directions.   Chest: Clear to auscultation without wheezing, rhonchi or crackles noted.  Heart: S1+S2+0, regular and normal without murmurs, rubs or gallops noted.   Abdomen: Soft, non-tender and non-distended with normal bowel sounds appreciated on auscultation.  Extremities: There is no pitting edema in the distal lower extremities bilaterally. Pedal pulses are intact.  Skin: Warm and dry without trophic changes noted.  Musculoskeletal: exam reveals no obvious joint deformities, tenderness or joint swelling or erythema.   Neurologically:  Mental status: The patient is awake, alert and oriented in all 4 spheres. Her immediate and remote memory, attention, language skills and fund of  knowledge are appropriate. There is no evidence of aphasia, agnosia, apraxia or anomia. Speech is clear with normal prosody and enunciation. Thought process is linear. Mood is normal and affect is normal.  Cranial nerves II - XII are as described above under HEENT exam.  Motor exam: thin bulk, fairly good strength, normal tone. She has no focal atrophy but globally reduced muscle bulk, especially noticeable in both hands. No fasciculations noted in the calf muscles in particular or forearm muscles or biceps areas. Reflexes are 1+ in the upper extremities, perhaps trace in both knees and absent in the ankles. Babinski: Toes are flexor bilaterally. Fine motor skills and coordination: intact with normal.  Cerebellar testing: No dysmetria or intention tremor on finger to nose testing. Heel to shin is unremarkable bilaterally. There is no truncal or gait ataxia.  Sensory exam: intact to light touch.  Gait, station and balance: She stands easily. No veering to one side is noted. No leaning to one side is noted. Posture is age-appropriate and stance is narrow based. Gait shows normal stride length and normal pace. No problems turning are noted. She can do tandem walk well. Balance is fairly well preserved.   Assessment and plan:  In summary, Cyrstal Leitz is a very pleasant 63 y.o.-year old female with an underlying medical history of anxiety, depression,history of right hand fracture, left foot injury, degenerative neck and lower back disease, reflux disease, palpitations, who presents for Follow-up consultation of her numbness and tingling, weakness, Neck pain, low back pain,And reports more recently pain affecting the left side of her face intermittently.Her examination is stable, mostly benign.  Work-up has been benign in the form of blood work with [place] and blood work with PCP, EMG and nerve conduction testingBenign as well, cervical spine MRI showed mild degenerative changes. She is advised to seek an  opinion with a neuromuscular specialist, she would [facility] to [facility] because of locationShe is advised to try carbamazepine, she does report that the pain prevents her from falling asleep at night.  She has tried gabapentin in the past for another indication and did not like how it made her feel.She had  mood related side effects she reports.  We talked about carbamazepine and its expectations and side effects.  She will need blood work to make sure she does not have any hypo-natremia or low white cell count from it.  I suggested a follow-up with 1 of our nurse practitioners in 1 month and we will do CBC with differential and CMP at the time.  In addition, we will proceed with a trigeminal MRI.We will call her with her MRI results.  I placed a referral for [place] neurology today.  I answered all her questions today and she was in agreement with the above plan, I provided her with a new prescription for carbamazepine.  I spent 25 minutes in total face-to-face time with the patient, more than 50% of which was spent in counseling and coordination of care, reviewing test results, reviewing medication and discussing or reviewing the diagnosis of Numbness and tingling, facial pain, the prognosis and treatment options. Pertinent laboratory and imaging test results that were available during this visit with the patient were reviewed by me and considered in my medical decision making (see chart for details).

## 2019-06-02 ENCOUNTER — Telehealth: Payer: Self-pay | Admitting: Neurology

## 2019-06-02 NOTE — Telephone Encounter (Signed)
The MRI will need to be switch to MRI Brain w/wo contrast and put in the comments in the order attn: trigeminal neuralgia. A MRI Brain w/wo contrast will get approved with that but the MR/Face Trigeminal will not get approved it would have to go into p2p.

## 2019-06-03 ENCOUNTER — Ambulatory Visit: Payer: BC Managed Care – PPO | Admitting: Neurology

## 2019-06-03 NOTE — Telephone Encounter (Signed)
no to the covid-19 questions MR [facility] w/wo contrast Dr. Reece Leader Auth: [id number] (exp. 06/03/19 to 11/29/19). Patient is scheduled at [facility] for 06/08/19.

## 2019-06-03 NOTE — Telephone Encounter (Signed)
Made addendum to office note, canc MRI Trigem nerve/face and ordered br MRI w/wo

## 2019-06-03 NOTE — Addendum Note (Signed)
Addended by: Star Age on: 06/03/2019 09:52 AM   Modules accepted: Orders

## 2019-06-08 ENCOUNTER — Ambulatory Visit: Payer: BC Managed Care – PPO

## 2019-06-08 ENCOUNTER — Other Ambulatory Visit: Payer: Self-pay

## 2019-06-08 DIAGNOSIS — R202 Paresthesia of skin: Secondary | ICD-10-CM

## 2019-06-08 DIAGNOSIS — G500 Trigeminal neuralgia: Secondary | ICD-10-CM | POA: Diagnosis not present

## 2019-06-08 DIAGNOSIS — R29898 Other symptoms and signs involving the musculoskeletal system: Secondary | ICD-10-CM | POA: Diagnosis not present

## 2019-06-08 DIAGNOSIS — M62549 Muscle wasting and atrophy, not elsewhere classified, unspecified hand: Secondary | ICD-10-CM | POA: Diagnosis not present

## 2019-06-08 DIAGNOSIS — R51 Headache: Secondary | ICD-10-CM

## 2019-06-08 DIAGNOSIS — R200 Anesthesia of skin: Secondary | ICD-10-CM | POA: Diagnosis not present

## 2019-06-08 DIAGNOSIS — R519 Headache, unspecified: Secondary | ICD-10-CM

## 2019-06-08 DIAGNOSIS — G4489 Other headache syndrome: Secondary | ICD-10-CM

## 2019-06-08 MED ORDER — GADOBENATE DIMEGLUMINE 529 MG/ML IV SOLN
11.0000 mL | Freq: Once | INTRAVENOUS | Status: AC | PRN
  Administered 2019-06-08: 11 mL via INTRAVENOUS

## 2019-06-11 NOTE — Progress Notes (Signed)
Please update pt re: brain MRI w/wo contrast: this was reported as normal, which is of course reassuring. As planned, she can FU in one mo with the NP, to see how she is doing on the carbamazepine for her facial pain.  Michel Bickers

## 2019-06-14 ENCOUNTER — Telehealth: Payer: Self-pay

## 2019-06-14 NOTE — Telephone Encounter (Signed)
Pt returned my call. She reports that she does not want to take the tegretol since her MRI was normal and did not show anything wrong with her TN. She reports that she has severe intermittent facial pain. She will ask Dr. Inda Merlin for a hematology referral since her IgM was elevated. She will follow up with the [facility]. She declined a follow up with Dr. Rexene Alberts at this time.

## 2019-06-14 NOTE — Telephone Encounter (Signed)
I called pt, left a detailed message on her cell phone per DPR, with her MRI results and recommendations. I asked pt to call us back to schedule her 1 month follow up and to call back for questions and concerns.

## 2019-06-14 NOTE — Telephone Encounter (Signed)
-----   Message from Star Age, MD sent at 06/11/2019  3:07 PM EDT ----- Please update pt re: brain MRI w/wo contrast: this was reported as normal, which is of course reassuring. As planned, she can FU in one mo with the NP, to see how she is doing on the carbamazepine for her facial pain.  Michel Bickers

## 2019-08-11 ENCOUNTER — Other Ambulatory Visit: Payer: Self-pay | Admitting: *Deleted

## 2019-08-11 MED ORDER — TRAMADOL HCL 50 MG PO TABS: 50.0000 mg | ORAL_TABLET | Freq: Two times a day (BID) | ORAL | 0 refills | Status: DC | PRN

## 2019-09-09 ENCOUNTER — Other Ambulatory Visit: Payer: Self-pay | Admitting: Nurse Practitioner

## 2019-09-09 DIAGNOSIS — R222 Localized swelling, mass and lump, trunk: Secondary | ICD-10-CM

## 2019-09-10 ENCOUNTER — Ambulatory Visit
Admission: RE | Admit: 2019-09-10 | Discharge: 2019-09-10 | Disposition: A | Discharge status: Home / Self Care | Payer: BC Managed Care – PPO | Source: Ambulatory Visit | Attending: Nurse Practitioner | Admitting: Nurse Practitioner

## 2019-09-10 ENCOUNTER — Other Ambulatory Visit: Payer: BC Managed Care – PPO

## 2019-09-10 DIAGNOSIS — R222 Localized swelling, mass and lump, trunk: Secondary | ICD-10-CM

## 2019-10-29 ENCOUNTER — Other Ambulatory Visit: Payer: Self-pay

## 2019-10-29 MED ORDER — TRAMADOL HCL 50 MG PO TABS: 50.0000 mg | ORAL_TABLET | Freq: Two times a day (BID) | ORAL | 0 refills | Status: DC | PRN

## 2019-12-29 ENCOUNTER — Other Ambulatory Visit: Payer: Self-pay | Admitting: *Deleted

## 2019-12-29 MED ORDER — TRAMADOL HCL 50 MG PO TABS: 50.0000 mg | ORAL_TABLET | Freq: Two times a day (BID) | ORAL | 0 refills | Status: DC | PRN

## 2020-07-03 ENCOUNTER — Other Ambulatory Visit: Payer: Self-pay | Admitting: Nurse Practitioner

## 2020-07-03 DIAGNOSIS — Z1231 Encounter for screening mammogram for malignant neoplasm of breast: Secondary | ICD-10-CM

## 2020-07-10 ENCOUNTER — Ambulatory Visit: Payer: BC Managed Care – PPO | Admitting: Family Medicine

## 2020-07-10 ENCOUNTER — Encounter: Payer: Self-pay | Admitting: Family Medicine

## 2020-07-10 ENCOUNTER — Ambulatory Visit: Payer: BC Managed Care – PPO

## 2020-07-10 ENCOUNTER — Other Ambulatory Visit: Payer: Self-pay

## 2020-07-10 VITALS — BP 100/60 | Ht 65.5 in | Wt 123.0 lb

## 2020-07-10 DIAGNOSIS — M7711 Lateral epicondylitis, right elbow: Secondary | ICD-10-CM

## 2020-07-10 NOTE — Patient Instructions (Signed)
You have lateral epicondylitis Try to avoid painful activities as much as possible. Ice the area 3-4 times a day for 15 minutes at a time. Tylenol or aleve as needed for pain. Counterforce brace as directed can help unload area - wear this regularly if it provides you with relief. Wrist brace can help also to limit the wrist extension during the day. Hammer rotation exercise, wrist extension exercise with 1 pound weight - 3 sets of 10 once a day.   Stretching - hold for 20-30 seconds and repeat 3 times. Consider physical therapy, nitro patches, injection if not improving. Follow up in 6 weeks but call me sooner if you're struggling.  The metatarsal pads are important to prevent worsening of your hammer toes. Toe loops can help keep these down also.

## 2020-07-10 NOTE — Progress Notes (Signed)
PCP: Marden Noble, MD  Subjective:   HPI: Patient is a 64 y.o. female here for right elbow pain.  Patient reports for about 1 month she has had lateral right elbow pain. She was pain is worse with gripping items and extending her wrist. No new numbness or tingling though she does have known neuropathy in her extremities including recent nerve conduction study showing cubital tunnel syndrome that is mild. She wears either a sleeve or an Ace wrap on her elbows but has not noticed much benefit with her lateral right elbow pain. Has tried topical Voltaren gel.  History reviewed. No pertinent past medical history.  Current Outpatient Medications on File Prior to Visit  Medication Sig Dispense Refill  . acetaminophen (TYLENOL) 325 MG tablet Take 650 mg by mouth as needed.    . ALPRAZOLAM XR 0.5 MG 24 hr tablet     . atorvastatin (LIPITOR) 10 MG tablet Take 10 mg by mouth daily.    . Calcium Carb-Cholecalciferol (CALCIUM 600 + D PO) Take by mouth daily.    . carbamazepine (TEGRETOL) 200 MG tablet 1 pill each bedtime for 1 week, then 1 pill twice daily thereafter. 60 tablet 3  . diclofenac sodium (VOLTAREN) 1 % GEL Apply 2 g topically 4 (four) times daily. 1 Tube 2  . MILK THISTLE PO Take by mouth daily.    . Multiple Vitamins-Minerals (MULTIVITAMIN GUMMIES ADULT PO) Take by mouth daily.    . Omega-3 Fatty Acids (FISH OIL) 1000 MG CAPS Take by mouth daily.    Marland Kitchen OVER THE COUNTER MEDICATION CBD oil    . QC ASPIRIN LOW DOSE 81 MG chewable tablet Chew by mouth.    Marland Kitchen VIVELLE-DOT 0.025 MG/24HR Place 0.025 patches onto the skin 2 (two) times a week.     No current facility-administered medications on file prior to visit.    Past Surgical History:  Procedure Laterality Date  . APPENDECTOMY      Allergies  Allergen Reactions  . Other Anaphylaxis  . Sulfa Antibiotics Other (See Comments)    AS A CHILD  . Bee Venom   . Ciprofloxacin   . Dilantin [Phenytoin]   . Latex   . Penicillins      Social History   Socioeconomic History  . Marital status: Widowed    Spouse name: Not on file  . Number of children: Not on file  . Years of education: Not on file  . Highest education level: Not on file  Occupational History  . Not on file  Tobacco Use  . Smoking status: Never Smoker  . Smokeless tobacco: Never Used  Vaping Use  . Vaping Use: Never used  Substance and Sexual Activity  . Alcohol use: Yes  . Drug use: No  . Sexual activity: Not on file  Other Topics Concern  . Not on file  Social History Narrative  . Not on file   Social Determinants of Health   Financial Resource Strain:   . Difficulty of Paying Living Expenses:   Food Insecurity:   . Worried About Programme researcher, broadcasting/film/video in the Last Year:   . Barista in the Last Year:   Transportation Needs:   . Freight forwarder (Medical):   Marland Kitchen Lack of Transportation (Non-Medical):   Physical Activity:   . Days of Exercise per Week:   . Minutes of Exercise per Session:   Stress:   . Feeling of Stress :   Social Connections:   .  Frequency of Communication with Friends and Family:   . Frequency of Social Gatherings with Friends and Family:   . Attends Religious Services:   . Active Member of Clubs or Organizations:   . Attends Banker Meetings:   Marland Kitchen Marital Status:   Intimate Partner Violence:   . Fear of Current or Ex-Partner:   . Emotionally Abused:   Marland Kitchen Physically Abused:   . Sexually Abused:     History reviewed. No pertinent family history.  BP (!) 100/60   Ht 5' 5.5" (1.664 m)   Wt 123 lb (55.8 kg)   BMI 20.16 kg/m   Review of Systems: See HPI above.     Objective:  Physical Exam:  Gen: NAD, comfortable in exam room  Right elbow: No gross deformity, swelling, bruising. Tenderness to palpation lateral epicondyle.  No other tenderness. Full range of motion with 5 out of 5 strength elbow flexion extension.  Pain reproduced with wrist and third digit extension.  No  pain with supination. Collateral ligaments intact. Neurovascularly intact distally.   Assessment & Plan:  1.  Right lateral epicondylitis: Shown home exercises and stretches to do daily.  Tylenol or Aleve if needed.  Icing.  Counterforce brace or elbow sleeve whichever feels more comfortable.  Discussed wrist brace may also be helpful.  Consider physical therapy, nitro patches, steroid injection if not improving.  Follow-up in 6 weeks.  She also had questions regarding her hammertoes bilaterally.  Sports insoles were fashioned with new medium metatarsal pads and she was also provided with toe loops.

## 2020-08-29 ENCOUNTER — Other Ambulatory Visit: Payer: Self-pay | Admitting: Internal Medicine

## 2020-08-29 DIAGNOSIS — Z1231 Encounter for screening mammogram for malignant neoplasm of breast: Secondary | ICD-10-CM

## 2020-08-31 ENCOUNTER — Ambulatory Visit
Admission: RE | Admit: 2020-08-31 | Discharge: 2020-08-31 | Disposition: A | Discharge status: Home / Self Care | Payer: BC Managed Care – PPO | Source: Ambulatory Visit

## 2020-08-31 ENCOUNTER — Other Ambulatory Visit: Payer: Self-pay

## 2020-08-31 ENCOUNTER — Encounter: Payer: Self-pay | Admitting: Family Medicine

## 2020-08-31 ENCOUNTER — Ambulatory Visit: Payer: BC Managed Care – PPO | Admitting: Family Medicine

## 2020-08-31 ENCOUNTER — Ambulatory Visit: Payer: Self-pay

## 2020-08-31 VITALS — BP 125/71 | HR 55 | Ht 66.0 in | Wt 121.0 lb

## 2020-08-31 DIAGNOSIS — M25512 Pain in left shoulder: Secondary | ICD-10-CM

## 2020-08-31 DIAGNOSIS — M25412 Effusion, left shoulder: Secondary | ICD-10-CM | POA: Insufficient documentation

## 2020-08-31 DIAGNOSIS — Z1231 Encounter for screening mammogram for malignant neoplasm of breast: Secondary | ICD-10-CM

## 2020-08-31 DIAGNOSIS — M5412 Radiculopathy, cervical region: Secondary | ICD-10-CM | POA: Diagnosis not present

## 2020-08-31 NOTE — Progress Notes (Signed)
Medication Samples have been provided to the patient.  Drug name: Rayos       Strength: 5mg        Qty: 2 boxes  LOT: A  Exp.Date: 06/2021  Dosing instructions: take 3 tablets as close to 10 pm as possible.  The patient has been instructed regarding the correct time, dose, and frequency of taking this medication, including desired effects and most common side effects.   07/2021, Kathi Simpers 5:26 PM 08/31/2020

## 2020-08-31 NOTE — Progress Notes (Signed)
Medication Samples have been provided to the patient.  Drug name: Duexis       Strength: 800mg /26.6mg         Qty: 1 box  LOT:  Exp.Date: 03/2021  Dosing instructions: take 1 tablet by mouth three (3) times a day.   The patient has been instructed regarding the correct time, dose, and frequency of taking this medication, including desired effects and most common side effects.   04/2021, MA 5:25 PM 08/31/2020

## 2020-08-31 NOTE — Assessment & Plan Note (Signed)
Does seem to have some irritation and effusion of the joint itself.  Rotator cuff looks good. -Counseled on home exercise therapy and supportive care. -Provided Duexis samples. -Could consider glenohumeral injection.

## 2020-08-31 NOTE — Assessment & Plan Note (Signed)
Findings seem to have some association with a nerve irritation. -Provided Rayos samples. -Counseled on home exercise therapy and supportive care. -Could consider gabapentin.

## 2020-08-31 NOTE — Patient Instructions (Signed)
Nice to meet you Please try the Rayos. Three tablets as close to 10 pm as you can  Please use the duexis as needed afterwards  Please try ice  Please try the compression  Please try the exercises   Please send me a message in MyChart with any questions or updates.  Please see me back in 4 weeks or as needed.   --Dr. Jordan Likes

## 2020-08-31 NOTE — Progress Notes (Signed)
Wendy Morse - 64 y.o. female MRN [id number]  Date of birth: [birth year]  SUBJECTIVE:  Including CC & ROS.  Chief Complaint  Patient presents with  . Shoulder Pain    left shoulder    Wendy Morse is a 64 y.o. female that is presenting with left shoulder pain and left arm pain.  Has been ongoing for a few weeks.  Denies any specific inciting event.  She has pain around the shoulder as well as down into the arm and hand.  Has not tried any medications.  No history of surgery.   Review of Systems See HPI   HISTORY: Past Medical, Surgical, Social, and Family History Reviewed & Updated per EMR.   Pertinent Historical Findings include:  No past medical history on file.  Past Surgical History:  Procedure Laterality Date  . APPENDECTOMY      No family history on file.  Social History   Socioeconomic History  . Marital status: Widowed    Spouse name: Not on file  . Number of children: Not on file  . Years of education: Not on file  . Highest education level: Not on file  Occupational History  . Not on file  Tobacco Use  . Smoking status: Never Smoker  . Smokeless tobacco: Never Used  Vaping Use  . Vaping Use: Never used  Substance and Sexual Activity  . Alcohol use: Yes  . Drug use: No  . Sexual activity: Not on file  Other Topics Concern  . Not on file  Social History Narrative  . Not on file   Social Determinants of Health   Financial Resource Strain:   . Difficulty of Paying Living Expenses: Not on file  Food Insecurity:   . Worried About Programme researcher, broadcasting/film/video in the Last Year: Not on file  . Ran Out of Food in the Last Year: Not on file  Transportation Needs:   . Lack of Transportation (Medical): Not on file  . Lack of Transportation (Non-Medical): Not on file  Physical Activity:   . Days of Exercise per Week: Not on file  . Minutes of Exercise per Session: Not on file  Stress:   . Feeling of Stress : Not on file  Social Connections:   .  Frequency of Communication with Friends and Family: Not on file  . Frequency of Social Gatherings with Friends and Family: Not on file  . Attends Religious Services: Not on file  . Active Member of Clubs or Organizations: Not on file  . Attends Banker Meetings: Not on file  . Marital Status: Not on file  Intimate Partner Violence:   . Fear of Current or Ex-Partner: Not on file  . Emotionally Abused: Not on file  . Physically Abused: Not on file  . Sexually Abused: Not on file     PHYSICAL EXAM:  VS: BP 125/71   Pulse (!) 55   Ht 5\' 6"  (1.676 m)   Wt 121 lb (54.9 kg)   BMI 19.53 kg/m  Physical Exam Gen: NAD, alert, cooperative with exam, well-appearing MSK:  Left shoulder: Normal range of motion. Normal external rotation and abduction. Normal strength resistance. Neck: Negative Spurling's test. Neurovascularly intact.  Limited ultrasound: left shoulder:  Mild effusion around the biceps tendon. Normal-appearing subscapularis. Normal-appearing supraspinatus and static dynamic testing.  Mild subacromial bursitis. Mild effusion the posterior glenohumeral joint.  There is increased hyperemia associated with the posterior labrum.  Summary: Findings are consistent with subacromial bursitis,  joint effusion increased hyperemia of the posterior capsule.  Ultrasound and interpretation by Clare Gandy, MD    ASSESSMENT & PLAN:   Cervical radiculopathy Findings seem to have some association with a nerve irritation. -Provided Rayos samples. -Counseled on home exercise therapy and supportive care. -Could consider gabapentin.  Effusion of joint of left shoulder Does seem to have some irritation and effusion of the joint itself.  Rotator cuff looks good. -Counseled on home exercise therapy and supportive care. -Provided Duexis samples. -Could consider glenohumeral injection.

## 2021-05-22 ENCOUNTER — Other Ambulatory Visit: Payer: Self-pay | Admitting: Internal Medicine

## 2021-05-22 DIAGNOSIS — J0140 Acute pansinusitis, unspecified: Secondary | ICD-10-CM

## 2021-05-25 ENCOUNTER — Other Ambulatory Visit: Payer: BC Managed Care – PPO

## 2021-06-07 ENCOUNTER — Other Ambulatory Visit: Payer: Self-pay

## 2021-06-07 ENCOUNTER — Ambulatory Visit
Admission: RE | Admit: 2021-06-07 | Discharge: 2021-06-07 | Disposition: A | Discharge status: Home / Self Care | Payer: BC Managed Care – PPO | Source: Ambulatory Visit | Attending: Internal Medicine | Admitting: Internal Medicine

## 2021-06-07 DIAGNOSIS — J0140 Acute pansinusitis, unspecified: Secondary | ICD-10-CM

## 2021-08-28 ENCOUNTER — Other Ambulatory Visit: Payer: Self-pay | Admitting: Internal Medicine

## 2021-08-28 DIAGNOSIS — Z1231 Encounter for screening mammogram for malignant neoplasm of breast: Secondary | ICD-10-CM

## 2021-09-04 ENCOUNTER — Other Ambulatory Visit: Payer: Self-pay | Admitting: Nurse Practitioner

## 2021-09-04 DIAGNOSIS — M8589 Other specified disorders of bone density and structure, multiple sites: Secondary | ICD-10-CM

## 2021-09-26 ENCOUNTER — Other Ambulatory Visit: Payer: Self-pay

## 2021-09-26 ENCOUNTER — Ambulatory Visit
Admission: RE | Admit: 2021-09-26 | Discharge: 2021-09-26 | Disposition: A | Discharge status: Home / Self Care | Payer: BC Managed Care – PPO | Source: Ambulatory Visit

## 2021-09-26 DIAGNOSIS — Z1231 Encounter for screening mammogram for malignant neoplasm of breast: Secondary | ICD-10-CM

## 2021-10-03 ENCOUNTER — Other Ambulatory Visit: Payer: Self-pay | Admitting: Internal Medicine

## 2021-10-03 DIAGNOSIS — R928 Other abnormal and inconclusive findings on diagnostic imaging of breast: Secondary | ICD-10-CM

## 2021-10-08 ENCOUNTER — Other Ambulatory Visit: Payer: Self-pay

## 2021-10-08 ENCOUNTER — Other Ambulatory Visit: Payer: Self-pay | Admitting: Internal Medicine

## 2021-10-08 ENCOUNTER — Ambulatory Visit
Admission: RE | Admit: 2021-10-08 | Discharge: 2021-10-08 | Disposition: A | Discharge status: Home / Self Care | Payer: BC Managed Care – PPO | Source: Ambulatory Visit | Attending: Internal Medicine | Admitting: Internal Medicine

## 2021-10-08 DIAGNOSIS — R928 Other abnormal and inconclusive findings on diagnostic imaging of breast: Secondary | ICD-10-CM

## 2021-10-08 DIAGNOSIS — N6489 Other specified disorders of breast: Secondary | ICD-10-CM

## 2021-10-10 ENCOUNTER — Ambulatory Visit
Admission: RE | Admit: 2021-10-10 | Discharge: 2021-10-10 | Disposition: A | Discharge status: Home / Self Care | Payer: BC Managed Care – PPO | Source: Ambulatory Visit | Attending: Internal Medicine | Admitting: Internal Medicine

## 2021-10-10 ENCOUNTER — Other Ambulatory Visit: Payer: Self-pay

## 2021-10-10 DIAGNOSIS — N6489 Other specified disorders of breast: Secondary | ICD-10-CM

## 2021-10-22 ENCOUNTER — Other Ambulatory Visit: Payer: BC Managed Care – PPO

## 2021-10-27 IMAGING — MG DIGITAL SCREENING BILAT W/ TOMO W/ CAD
8 series · 9 of 24 positions shown · non-contrast
Comparison: Previous exam(s).

CLINICAL DATA: Screening.

EXAM:
DIGITAL SCREENING BILATERAL MAMMOGRAM WITH TOMO AND CAD

[L CC synth-2D]
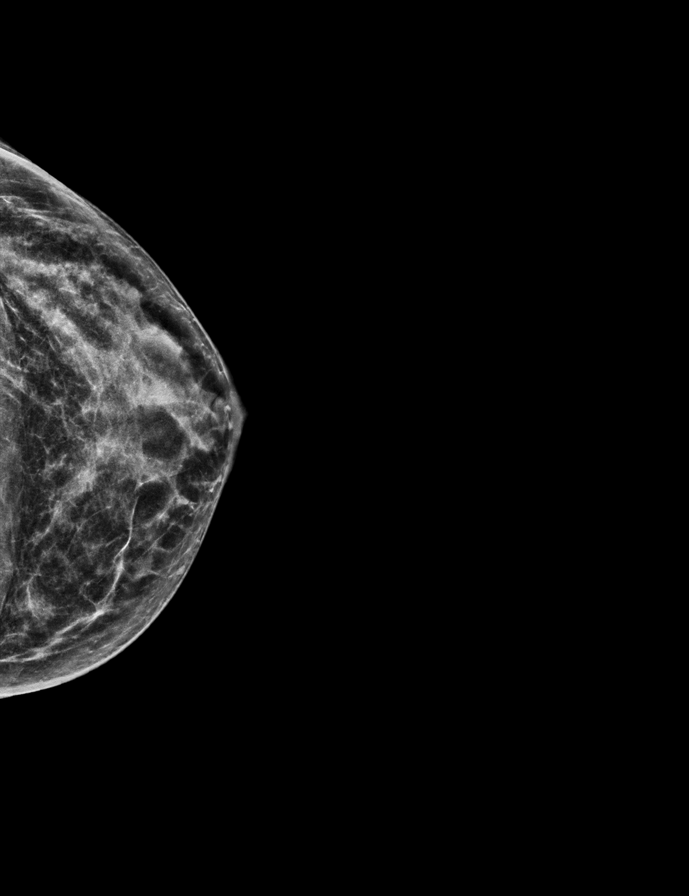

[L MLO synth-2D]
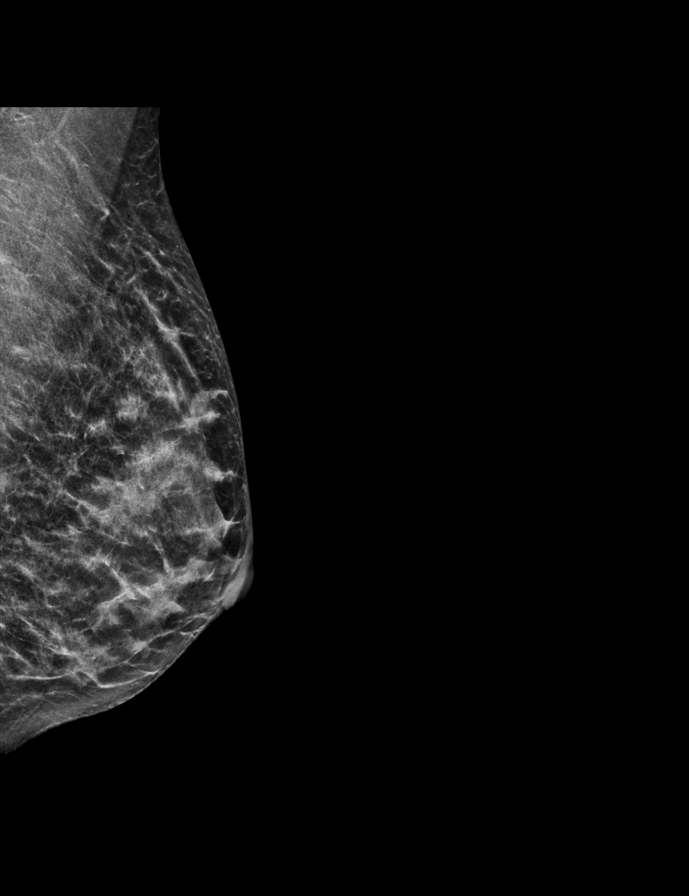

[R MLO synth-2D]
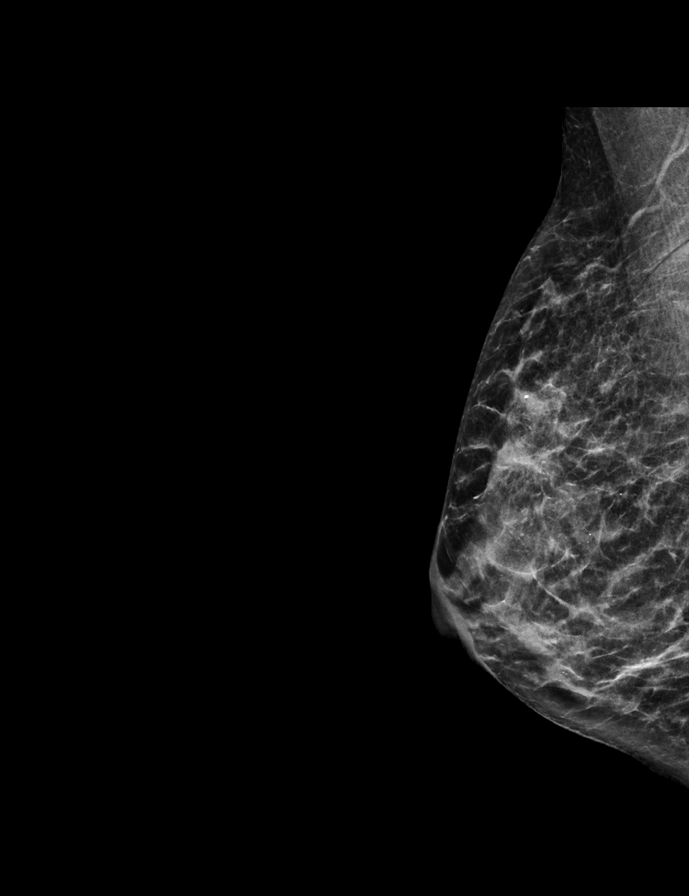

[R CC synth-2D]
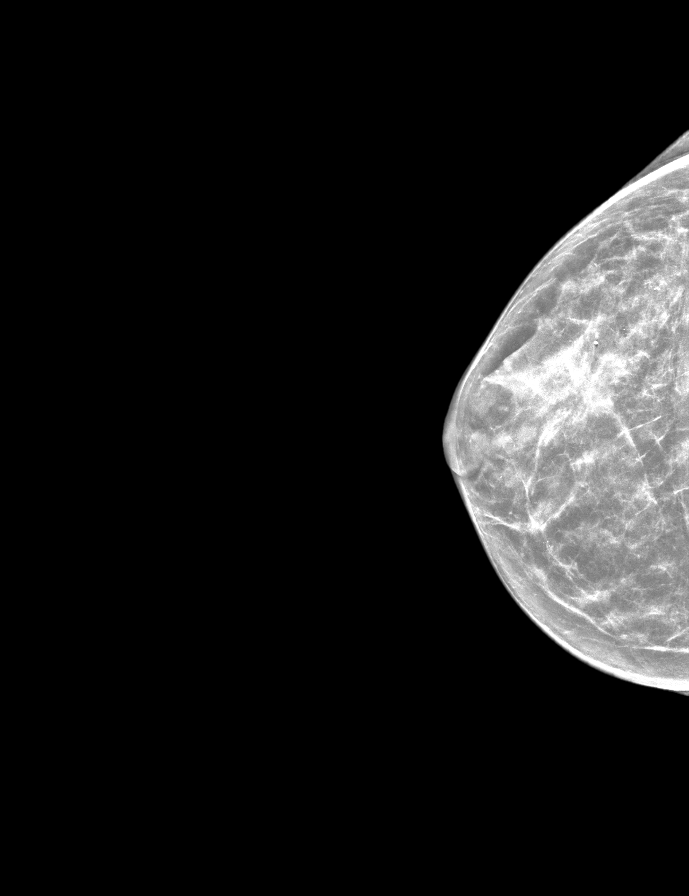

[L CC tomo · 2 of 50 frames shown]
[frame 17/50]
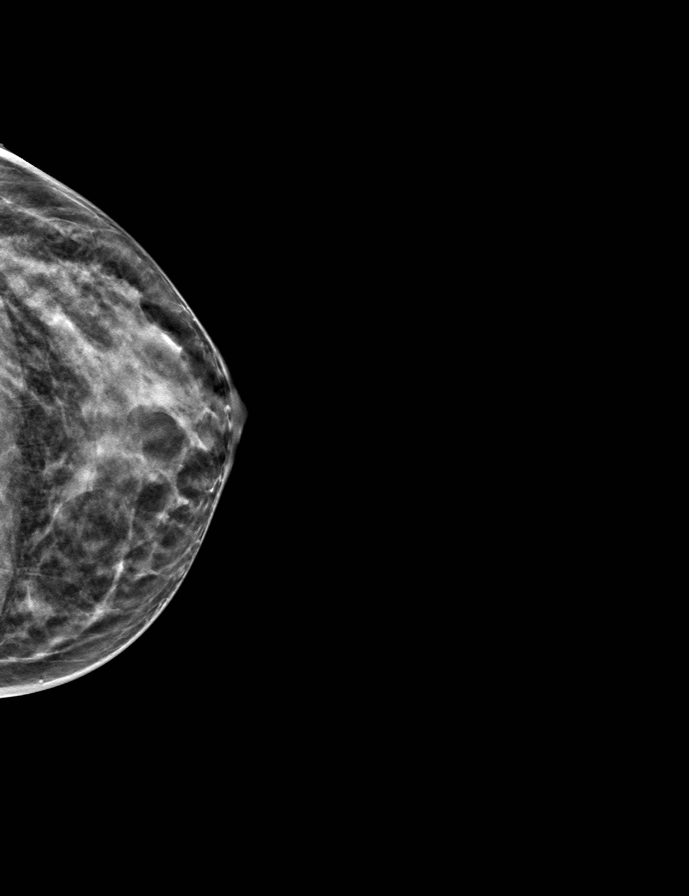
[frame 25/50]
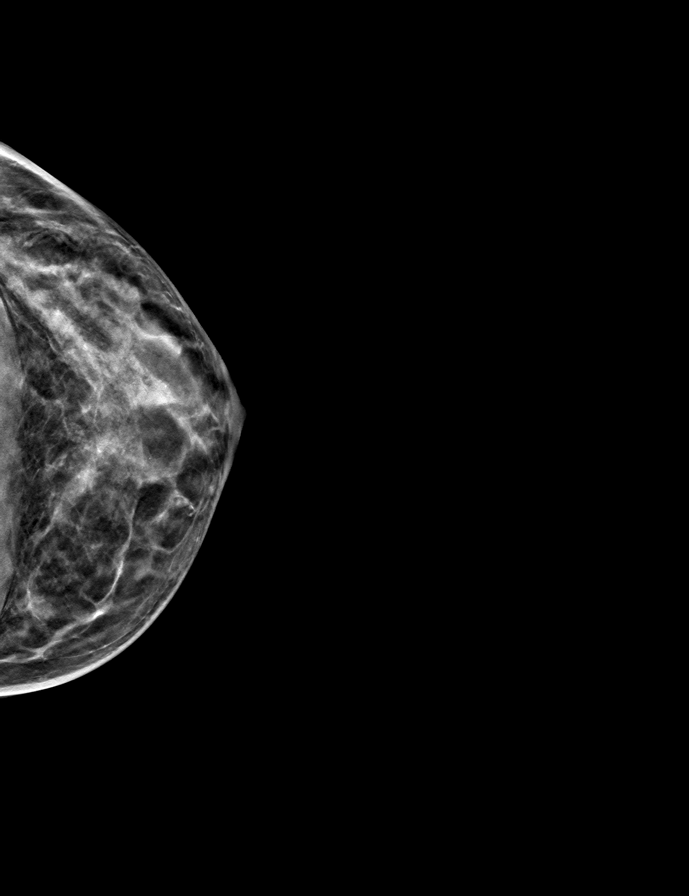

[L MLO tomo · tomo slice 23/44.0]
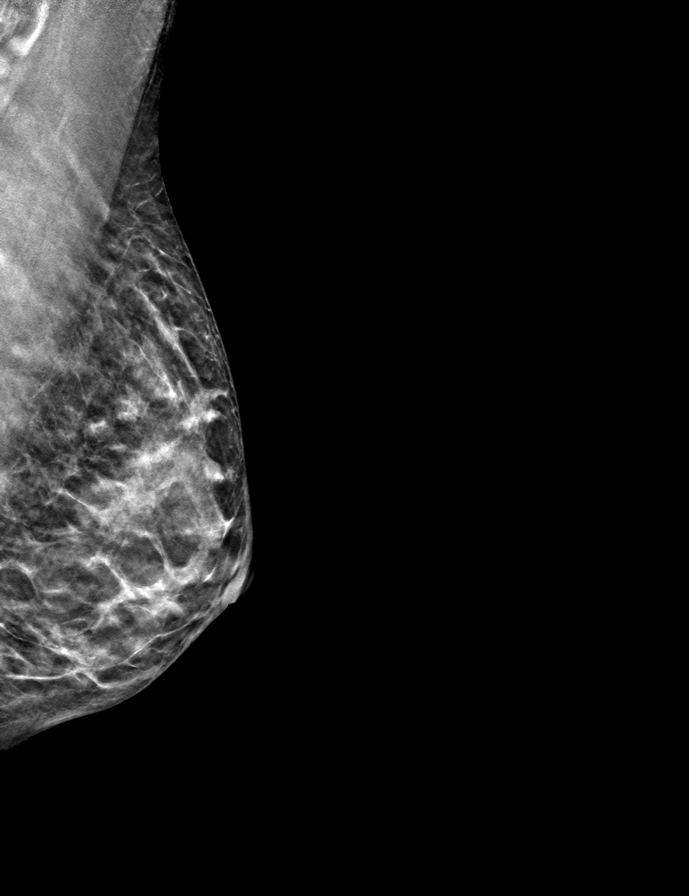

[R MLO tomo · tomo slice 24/47.0]
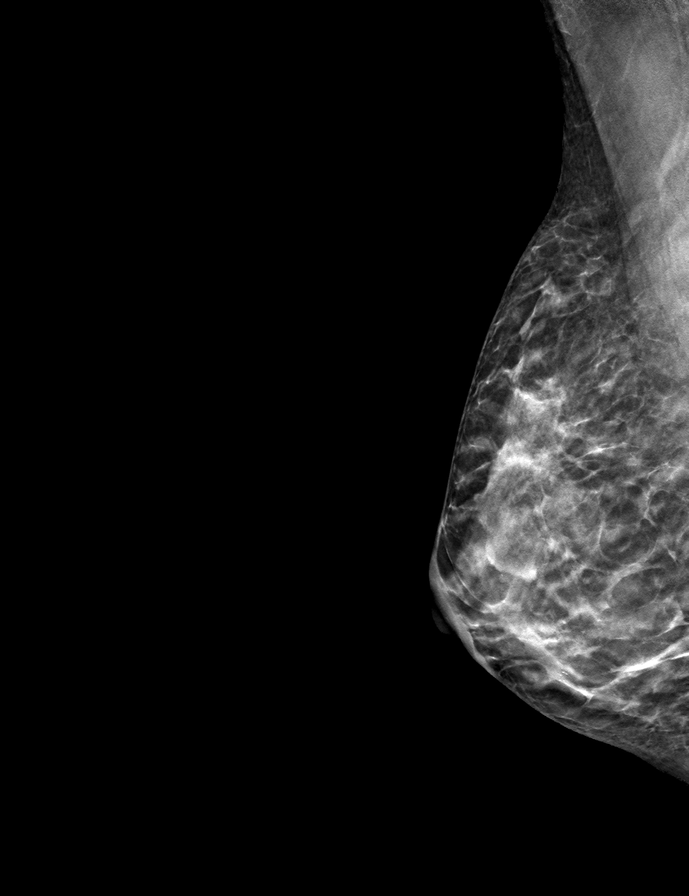

[R CC tomo · tomo slice 27/54.0]
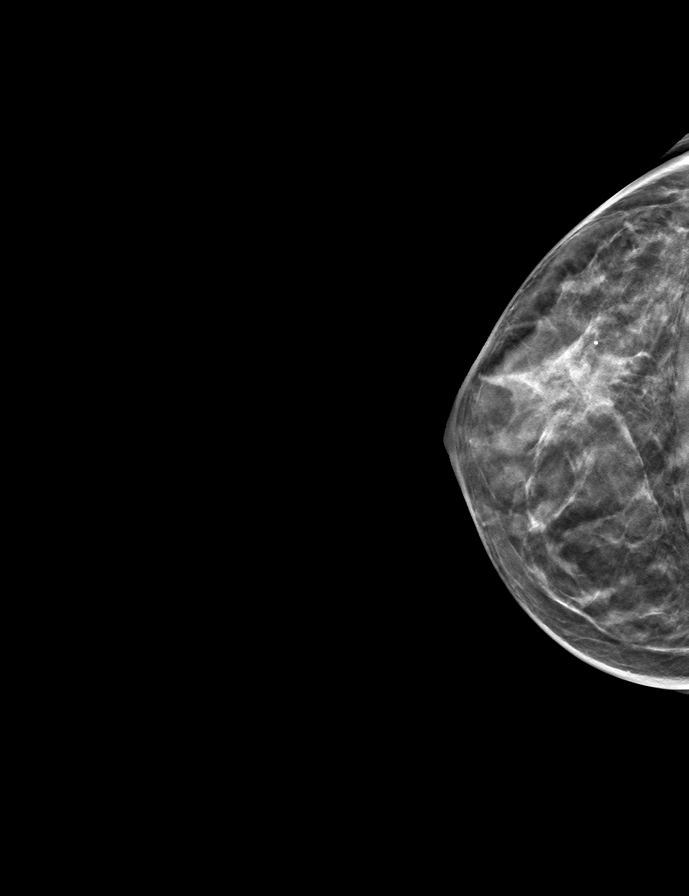

[9 of 24 positions shown; findings below may reference images not displayed]

ACR Breast Density Category c: The breast tissue is heterogeneously
dense, which may obscure small masses.
FINDINGS: There are no findings suspicious for malignancy. Images were
processed with CAD.
IMPRESSION: No mammographic evidence of malignancy. A result letter of this
screening mammogram will be mailed directly to the patient.

RECOMMENDATION:
Screening mammogram in one year. (Code:FT-U-LHB)

BI-RADS CATEGORY  1: Negative.

## 2022-02-11 ENCOUNTER — Other Ambulatory Visit: Payer: Self-pay

## 2022-02-11 DIAGNOSIS — M545 Low back pain, unspecified: Secondary | ICD-10-CM

## 2022-02-11 DIAGNOSIS — M549 Dorsalgia, unspecified: Secondary | ICD-10-CM

## 2022-02-11 NOTE — Addendum Note (Signed)
Addended by: Jolinda Croak E on: 02/11/2022 11:13 AM   Modules accepted: Orders

## 2022-02-15 ENCOUNTER — Ambulatory Visit (INDEPENDENT_AMBULATORY_CARE_PROVIDER_SITE_OTHER): Payer: BC Managed Care – PPO | Admitting: Sports Medicine

## 2022-02-15 ENCOUNTER — Ambulatory Visit
Admission: RE | Admit: 2022-02-15 | Discharge: 2022-02-15 | Disposition: A | Discharge status: Home / Self Care | Payer: BC Managed Care – PPO | Source: Ambulatory Visit | Attending: Sports Medicine | Admitting: Sports Medicine

## 2022-02-15 VITALS — BP 108/68 | Ht 65.5 in | Wt 125.0 lb

## 2022-02-15 DIAGNOSIS — R269 Unspecified abnormalities of gait and mobility: Secondary | ICD-10-CM

## 2022-02-15 DIAGNOSIS — M4300 Spondylolysis, site unspecified: Secondary | ICD-10-CM | POA: Diagnosis not present

## 2022-02-15 DIAGNOSIS — M545 Low back pain, unspecified: Secondary | ICD-10-CM

## 2022-02-15 MED ORDER — TRAMADOL HCL 50 MG PO TABS: 50.0000 mg | ORAL_TABLET | Freq: Four times a day (QID) | ORAL | 1 refills | Status: AC | PRN

## 2022-02-15 NOTE — Progress Notes (Signed)
Patient was instructed in 10 minutes of therapeutic exercises for low back pain to improve strength, ROM and function according to my instructions and plan of care by a Certified Athletic Trainer during the office visit.  Proper technique shown and discussed, handout provided.  All questions discussed and answered.  °

## 2022-02-15 NOTE — Progress Notes (Signed)
? ?  Subjective:  ? ? Patient ID: Wendy Morse, female    DOB: 04-08-1956, 66 y.o.   MRN: [id number] ? ?HPI chief complaint: Low back pain ? ?[name] presents today complaining of low back pain.  She has had low back pain before.  An MRI scan of her lumbar spine in 2015 showed lumbar spondylosis and degenerative disc disease with a dominant finding being a 5 mm grade 1 degenerative anterior listhesis at L4-L5 where she also had considerable facet arthropathy.  She has recently started treatment for stage I breast cancer which is going well.  She had to take a few weeks off from exercising.  Earlier this year she return to her regular exercise routine of elliptical, rowing machine, recumbent bike, and most recently road biking.  Her pain is diffuse across the low back.  She does get occasional radiating pain into her legs and has noticed some left leg weakness.  Her symptoms are most noticeable with sitting.  Tylenol at night to help her sleep.  No recent trauma. ? ? ? ?Review of Systems ?As above ?   ?Objective:  ? Physical Exam ? ?Well-developed, well-nourished.  No acute distress.  Awake alert and oriented x3.  Vital signs reviewed ? ?Lumbar spine: Good lumbar range of motion.  Reproducible pain with extension.  There is some mild spasm of the paraspinal musculature bilaterally around the lower lumbar segments.  No tenderness along the midline.  Neurological exam does show some weakness with left hip flexion on the left compared to the right.  No atrophy.  Remainder of her strength is 5/5 in both lower extremities.  Sensation is intact light touch grossly. ? ?X-rays of her lumbar spine including AP and lateral views are obtained and compared to the MRI images from 2015.  There has been progression of her L4 on L5 anterior listhesis.  There is also moderate disc space narrowing at L5-S1 and facet arthropathy bilaterally at L4-L5 and L5-S1.  Nothing acute.  No metastatic disease. ? ? ?   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 ?MRI of the lumbar spine to evaluate for foraminal or spinal stenosis secondary to the L4 on L5 anterior listhesis.  Phone follow-up with those results when available.  In the meantime I am going to have our athletic trainer educate her on both Williams flexion exercises as well as McKenzie extension exercises.  Although she has an anterior listhesis on x-ray, her symptoms are worse sitting.  I have asked her to alternate between these types of exercises to see which is more beneficial.  I have also recommended that she avoid her road bike for a little bit until her symptoms improved.  I think she is okay to continue with her other forms of exercise.  I will refill her tramadol to take as needed.  She uses it sparingly.  We will delineate further treatment based on her MRI findings. ? ?This note was dictated using Dragon naturally speaking software and may contain errors in syntax, spelling, or content which have not been identified prior to signing this note.  ? ?

## 2022-02-18 ENCOUNTER — Other Ambulatory Visit: Payer: BC Managed Care – PPO

## 2022-03-04 ENCOUNTER — Other Ambulatory Visit: Payer: Self-pay

## 2022-03-04 ENCOUNTER — Ambulatory Visit
Admission: RE | Admit: 2022-03-04 | Discharge: 2022-03-04 | Disposition: A | Discharge status: Home / Self Care | Payer: BC Managed Care – PPO | Source: Ambulatory Visit | Attending: Sports Medicine | Admitting: Sports Medicine

## 2022-03-04 DIAGNOSIS — M545 Low back pain, unspecified: Secondary | ICD-10-CM

## 2022-03-11 ENCOUNTER — Telehealth: Payer: Self-pay | Admitting: Sports Medicine

## 2022-03-11 NOTE — Telephone Encounter (Signed)
?  I spoke with Wendy Morse today after reviewing the updated MRI of her lumbar spine.  There has been some progression of the degenerative changes seen on her MRI in 2015.  There is some worsening of the anterior listhesis at L4-L5.  There is also a rounded 9 mm structure closely approximating the left L4 nerve root which could be a sequestered disc fragment or possibly a synovial cyst.  In addition to her low back pain, Wendy Morse does endorse symptoms into the left leg occasionally.  She has found the home exercises to be beneficial and is working diligently on improving her core strength.  I think it would be wise for her to establish a relationship with Dr. Kristeen Morse.  Although her symptoms currently are not severe enough to warrant surgery, I think a consultation with him would be a good idea.  She agrees.  In the meantime, she will continue to stay as active as possible but will limit her workouts based on pain.  Follow-up with me as needed. ?

## 2022-06-10 ENCOUNTER — Ambulatory Visit (INDEPENDENT_AMBULATORY_CARE_PROVIDER_SITE_OTHER): Payer: BC Managed Care – PPO | Admitting: Family Medicine

## 2022-06-10 ENCOUNTER — Encounter: Payer: Self-pay | Admitting: Family Medicine

## 2022-06-10 ENCOUNTER — Ambulatory Visit
Admission: RE | Admit: 2022-06-10 | Discharge: 2022-06-10 | Disposition: A | Discharge status: Home / Self Care | Payer: BC Managed Care – PPO | Source: Ambulatory Visit | Attending: Family Medicine | Admitting: Family Medicine

## 2022-06-10 VITALS — BP 118/62 | Ht 66.0 in | Wt 125.0 lb

## 2022-06-10 DIAGNOSIS — M25562 Pain in left knee: Secondary | ICD-10-CM | POA: Diagnosis not present

## 2022-06-10 DIAGNOSIS — S3992XA Unspecified injury of lower back, initial encounter: Secondary | ICD-10-CM | POA: Diagnosis not present

## 2022-06-10 DIAGNOSIS — S79912A Unspecified injury of left hip, initial encounter: Secondary | ICD-10-CM | POA: Diagnosis not present
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
## 2022-06-12 ENCOUNTER — Other Ambulatory Visit: Payer: Self-pay

## 2022-06-12 DIAGNOSIS — S79912A Unspecified injury of left hip, initial encounter: Secondary | ICD-10-CM

## 2022-06-13 ENCOUNTER — Other Ambulatory Visit: Payer: Self-pay

## 2022-06-13 DIAGNOSIS — S3992XA Unspecified injury of lower back, initial encounter: Secondary | ICD-10-CM

## 2022-06-26 ENCOUNTER — Ambulatory Visit (INDEPENDENT_AMBULATORY_CARE_PROVIDER_SITE_OTHER): Payer: BC Managed Care – PPO | Admitting: Sports Medicine

## 2022-06-26 VITALS — BP 122/56 | Ht 65.5 in | Wt 125.0 lb

## 2022-06-26 DIAGNOSIS — M25552 Pain in left hip: Secondary | ICD-10-CM | POA: Diagnosis not present

## 2022-06-26 DIAGNOSIS — M25512 Pain in left shoulder: Secondary | ICD-10-CM | POA: Diagnosis not present

## 2022-06-26 DIAGNOSIS — M25562 Pain in left knee: Secondary | ICD-10-CM | POA: Diagnosis not present

## 2022-06-26 NOTE — Progress Notes (Unsigned)
   Subjective:    Patient ID: Wendy Morse, female    DOB: 1956/05/20, 66 y.o.   MRN: [id number]  HPI    Review of Systems     Objective:   Physical Exam        Assessment & Plan:

## 2022-06-28 ENCOUNTER — Other Ambulatory Visit: Payer: BC Managed Care – PPO

## 2022-07-08 ENCOUNTER — Other Ambulatory Visit: Payer: BC Managed Care – PPO

## 2022-07-11 ENCOUNTER — Ambulatory Visit: Payer: Self-pay

## 2022-07-11 ENCOUNTER — Ambulatory Visit
Admission: RE | Admit: 2022-07-11 | Discharge: 2022-07-11 | Disposition: A | Discharge status: Home / Self Care | Payer: BC Managed Care – PPO | Source: Ambulatory Visit | Attending: Sports Medicine | Admitting: Sports Medicine

## 2022-07-11 ENCOUNTER — Ambulatory Visit (INDEPENDENT_AMBULATORY_CARE_PROVIDER_SITE_OTHER): Payer: BC Managed Care – PPO | Admitting: Sports Medicine

## 2022-07-11 VITALS — BP 103/60 | Ht 65.5 in

## 2022-07-11 DIAGNOSIS — M25552 Pain in left hip: Secondary | ICD-10-CM

## 2022-07-11 DIAGNOSIS — M25412 Effusion, left shoulder: Secondary | ICD-10-CM | POA: Diagnosis not present

## 2022-07-11 DIAGNOSIS — M25562 Pain in left knee: Secondary | ICD-10-CM

## 2022-07-11 DIAGNOSIS — M25512 Pain in left shoulder: Secondary | ICD-10-CM

## 2022-07-11 NOTE — Progress Notes (Addendum)
Patient ID: Wendy Morse, female   DOB: 16-Jul-1956, 66 y.o.   MRN: [id number]  [name] presents today for a left shoulder ultrasound.  Findings are as below.  Overall, rotator cuff looks fairly healthy.  Her clinical exam during her last office visit also did not suggest rotator cuff pathology.  X-rays of her left knee and pelvis are pending.  Reassurance regarding her ultrasound findings and I will follow-up with her via telephone with x-ray results when available.  She may benefit from some physical therapy either in [place] or [place].  Ultrasound of the left shoulder:  Biceps tendon was well visualized in long and short axis in the bicipital groove.  No abnormality seen.  Acromioclavicular joint also well visualized without significant degenerative change.  Subscapularis showed some slight hypoechoic change at the footprint but otherwise was unremarkable.  Supraspinatus was well visualized from anterior to posterior without abnormality.  Infraspinatus also well visualized without abnormality.  Glenohumeral joint unremarkable.  Findings are consistent with minimal rotator cuff tendinopathy primarily in the subscapularis tendon.  No obvious tear seen.  This note was dictated using Dragon naturally speaking software and may contain errors in syntax, spelling, or content which have not been identified prior to signing this note.

## 2022-07-22 ENCOUNTER — Encounter: Payer: Self-pay | Admitting: Family Medicine

## 2022-07-22 ENCOUNTER — Ambulatory Visit (INDEPENDENT_AMBULATORY_CARE_PROVIDER_SITE_OTHER): Payer: BC Managed Care – PPO | Admitting: Family Medicine

## 2022-07-22 VITALS — BP 109/68 | Ht 65.5 in | Wt 125.0 lb

## 2022-07-22 DIAGNOSIS — M25532 Pain in left wrist: Secondary | ICD-10-CM | POA: Diagnosis not present
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
## 2022-07-22 NOTE — Patient Instructions (Signed)
You have an impacted distal radius fracture. Luckily this doesn't have angulation or involvement of the joint, I think you'll heal well without requiring more than splinting and casting. Wear the splint for 1 more week - get x-rays before you return here (I would recommend taking the splint off right before the x-rays so we can see things clearly). Tylenol if needed during the day, percocet in evening. Call me if you need anything more for pain. Expect [place] to place a cast next week that you will wear for 4 weeks. Have fun in [place] (well as much fun as you can there... haha).

## 2022-07-29 ENCOUNTER — Ambulatory Visit
Admission: RE | Admit: 2022-07-29 | Discharge: 2022-07-29 | Disposition: A | Discharge status: Home / Self Care | Payer: BC Managed Care – PPO | Source: Ambulatory Visit | Attending: Family Medicine | Admitting: Family Medicine

## 2022-07-29 ENCOUNTER — Ambulatory Visit (INDEPENDENT_AMBULATORY_CARE_PROVIDER_SITE_OTHER): Payer: BC Managed Care – PPO | Admitting: Family Medicine

## 2022-07-29 VITALS — BP 105/61 | Ht 65.5 in

## 2022-07-29 DIAGNOSIS — S52592D Other fractures of lower end of left radius, subsequent encounter for closed fracture with routine healing: Secondary | ICD-10-CM | POA: Diagnosis not present

## 2022-07-29 DIAGNOSIS — M25532 Pain in left wrist: Secondary | ICD-10-CM

## 2022-07-30 ENCOUNTER — Telehealth: Payer: Self-pay | Admitting: Sports Medicine

## 2022-07-30 ENCOUNTER — Encounter: Payer: Self-pay | Admitting: Family Medicine
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
## 2022-08-08 ENCOUNTER — Ambulatory Visit: Payer: BC Managed Care – PPO | Admitting: Sports Medicine

## 2022-08-12 ENCOUNTER — Encounter: Payer: Self-pay | Admitting: Family Medicine

## 2022-08-12 ENCOUNTER — Ambulatory Visit (INDEPENDENT_AMBULATORY_CARE_PROVIDER_SITE_OTHER): Payer: BC Managed Care – PPO | Admitting: Family Medicine

## 2022-08-12 VITALS — BP 93/62 | Ht 65.5 in | Wt 125.0 lb

## 2022-08-12 DIAGNOSIS — S52592D Other fractures of lower end of left radius, subsequent encounter for closed fracture with routine healing: Secondary | ICD-10-CM | POA: Diagnosis not present
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
## 2022-08-26 ENCOUNTER — Ambulatory Visit
Admission: RE | Admit: 2022-08-26 | Discharge: 2022-08-26 | Disposition: A | Discharge status: Home / Self Care | Payer: BC Managed Care – PPO | Source: Ambulatory Visit | Attending: Family Medicine | Admitting: Family Medicine

## 2022-08-26 ENCOUNTER — Encounter: Payer: Self-pay | Admitting: Family Medicine

## 2022-08-26 ENCOUNTER — Ambulatory Visit (INDEPENDENT_AMBULATORY_CARE_PROVIDER_SITE_OTHER): Payer: BC Managed Care – PPO | Admitting: Family Medicine

## 2022-08-26 VITALS — BP 90/60 | Ht 65.5 in | Wt 125.0 lb

## 2022-08-26 DIAGNOSIS — M25532 Pain in left wrist: Secondary | ICD-10-CM
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
## 2022-08-27 ENCOUNTER — Encounter: Payer: Self-pay | Admitting: Family Medicine

## 2023-03-31 ENCOUNTER — Encounter: Payer: Self-pay | Admitting: *Deleted

## 2023-05-29 ENCOUNTER — Ambulatory Visit: Payer: BC Managed Care – PPO | Admitting: Sports Medicine

## 2023-05-29 ENCOUNTER — Other Ambulatory Visit: Payer: Self-pay

## 2023-05-29 VITALS — BP 104/52 | Ht 66.0 in | Wt 125.0 lb

## 2023-05-29 DIAGNOSIS — M79672 Pain in left foot: Secondary | ICD-10-CM

## 2023-05-29 DIAGNOSIS — S92352G Displaced fracture of fifth metatarsal bone, left foot, subsequent encounter for fracture with delayed healing: Secondary | ICD-10-CM

## 2023-05-29 NOTE — Progress Notes (Signed)
PCP: Marden Noble, MD (Inactive)  Subjective:   HPI: Patient is a 67 y.o. female here for evaluation of left fifth metatarsal fracture.  She is about 3 months out from her injury, has been using a postop boot for all time.  She still having some pain around the area, she saw [facility] sports med on 5/30 and noted there that she has been using a bone stimulator for about 9 days at that point.  Plan at that visit was to refill tramadol which can be used for severe pain, continue walking boot with if she is walking for prolonged time otherwise stiff supportive shoes, continue bone stimulator for 6 to 12 weeks and vitamin D supplementation, and repeat x-rays in a month from that visit and consider orthotics at that time as well.  Her last foot x-ray was on the same day and showed similar appearance of mildly displaced oblique fifth metatarsal shaft fracture without significant changes in alignment, and similar small ossific fragmentation along the dorsal navicular which could reflect avulsion fracture.  There were no significant changes from the prior study.  Today, she reports similar history.  Fracture little more than 3 months ago which she feels like is not healing well.  Started using bone stimulator about 3 weeks ago.  Still has some pain along the plantar surface of her fifth metatarsal and also over her lateral dorsal foot.  More recently, she has been having pain along her medial foot as well.  She uses custom orthotics with a metatarsal pad, padding along the longitudinal arch, and lateral padding as well.  She is mostly back to usual activity, has been able to row, but just has some pain in her foot when she does.  She is back to walking and light activity on trails.  Of note, she mentions that she still has some pain along her left fibular head where she had a comminuted fracture as well as her left wrist where she had a prior fracture.  Reviewed recent imaging and she has a T-score of -1.2 on DEXA and  10-year FRAX score is about 7%.    Objective:   BP (!) 104/52   Ht 5\' 6"  (1.676 m)   Wt 125 lb (56.7 kg)   BMI 20.18 kg/m   Physical Exam:  Gen: NAD, comfortable in exam room  Left foot: No ecchymoses, edema, warmth or erythema.  Full range of motion and 5/5 strength with plantarflexion, dorsiflexion, inversion and eversion.  Minimal TTP along base of fifth metatarsal.  Limited ultrasound: Left fifth metatarsal: Left fifth metatarsal visualized in long and short axis.  Cortical irregularity/step-off visualized on lateral and dorsal side of distal fifth metatarsal shaft.  Cap sign with double cortex covering bone defect likely represents developing callus of her proximal fifth metatarsal but not distally. There is no hypoechoic soft tissue swelling.  Left fibular head: Fibular head cortex almost entirely intact, with minimal irregularities.  Small hyperechoic fragments adjacent to fibular head likely represent sequelae of prior comminuted fracture.  Left first metatarsal/MTP: Osteophyte formation and cortical irregularity surrounding first MTP and juxta articular bone cyst representing osteoarthritis of the MTP joint   Assessment & Plan:  1.  Left fifth metatarsal fracture: Patient has a history of fifth metatarsal fracture along with several other fractures over the past few years.  Oblique nondisplaced fifth metatarsal dancers fracture was about 3 months ago, without complete healing.  Still has some pain along the base of the fifth metatarsal.  Ultrasound shows  callus formation proximally but not distally.  Osteoporosis ruled out by DEXA.  She is currently taking vitamin D but not calcium supplementation. - Continue vitamin D and add calcium supplementation.  Would hold off on osteoporosis medicine at this time.  Seems like most of her fractures are traumatic in nature. - Placed arch strap today for stability of fifth metatarsal - Can continue regular activity, but will limit stress  on left foot.  Can use postop boot for anything that would require significant exertion.  Walks/bike rides okay in supportive shoes with orthotics -Continue bone stimulator -Follow-up with Dr Nedra Hai at [facility] sports med as planned for repeat x-rays, orthotics, and next steps.  They can consider refilling tramadol as appropriate.  I reassured her that the treatment seems appropriate but these fractures can be slow to heal.  Stay active but avoid too much stress on weight bearing or even rowing.  Surgery is an option if she simply fails to heal.  I observed and examined the patient with the resident and agree with assessment and plan.  Note reviewed and modified by me. Sterling Big, MD

## 2023-05-29 NOTE — Assessment & Plan Note (Signed)
Using boot Bone stimulator Add arch strap support to lessen vibration with weight bearing I told her that I agree with approach thus far although   She will continue follow up at [facility] and hopefully healing will progress

## 2023-08-11 ENCOUNTER — Other Ambulatory Visit: Payer: Self-pay | Admitting: Medical Genetics

## 2023-08-11 DIAGNOSIS — Z006 Encounter for examination for normal comparison and control in clinical research program: Secondary | ICD-10-CM

## 2023-08-20 ENCOUNTER — Ambulatory Visit: Payer: BC Managed Care – PPO | Admitting: Sports Medicine

## 2023-08-28 ENCOUNTER — Ambulatory Visit (INDEPENDENT_AMBULATORY_CARE_PROVIDER_SITE_OTHER): Payer: BC Managed Care – PPO | Admitting: Sports Medicine

## 2023-08-28 ENCOUNTER — Other Ambulatory Visit: Payer: Self-pay

## 2023-08-28 VITALS — BP 102/68 | Ht 66.0 in | Wt 127.0 lb

## 2023-08-28 DIAGNOSIS — M25552 Pain in left hip: Secondary | ICD-10-CM

## 2023-08-28 MED ORDER — METHYLPREDNISOLONE ACETATE 80 MG/ML IJ SUSP
40.0000 mg | Freq: Once | INTRAMUSCULAR | Status: AC
  Administered 2023-08-28: 40 mg via INTRA_ARTICULAR

## 2023-08-28 MED ORDER — TRAMADOL HCL 50 MG PO TABS: 50.0000 mg | ORAL_TABLET | Freq: Four times a day (QID) | ORAL | 2 refills | Status: AC | PRN
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
## 2023-10-07 ENCOUNTER — Other Ambulatory Visit: Payer: BC Managed Care – PPO

## 2023-10-14 ENCOUNTER — Other Ambulatory Visit
Admission: RE | Admit: 2023-10-14 | Discharge: 2023-10-14 | Disposition: A | Discharge status: Home / Self Care | Payer: BC Managed Care – PPO | Source: Ambulatory Visit | Attending: Medical Genetics | Admitting: Medical Genetics

## 2023-10-14 DIAGNOSIS — Z006 Encounter for examination for normal comparison and control in clinical research program: Secondary | ICD-10-CM | POA: Insufficient documentation

## 2023-10-17 ENCOUNTER — Emergency Department (HOSPITAL_COMMUNITY)
Admission: EM | Admit: 2023-10-17 | Discharge: 2023-10-17 | Disposition: A | Discharge status: Home / Self Care | Payer: BC Managed Care – PPO | Attending: Emergency Medicine | Admitting: Emergency Medicine

## 2023-10-17 ENCOUNTER — Encounter (HOSPITAL_COMMUNITY): Payer: Self-pay | Admitting: Emergency Medicine

## 2023-10-17 DIAGNOSIS — W540XXA Bitten by dog, initial encounter: Secondary | ICD-10-CM | POA: Diagnosis not present

## 2023-10-17 DIAGNOSIS — Z203 Contact with and (suspected) exposure to rabies: Secondary | ICD-10-CM | POA: Diagnosis not present

## 2023-10-17 DIAGNOSIS — S81851A Open bite, right lower leg, initial encounter: Secondary | ICD-10-CM | POA: Insufficient documentation

## 2023-10-17 DIAGNOSIS — Z91040 Latex allergy status: Secondary | ICD-10-CM | POA: Insufficient documentation

## 2023-10-17 DIAGNOSIS — Z23 Encounter for immunization: Secondary | ICD-10-CM | POA: Diagnosis not present

## 2023-10-17 DIAGNOSIS — Z2914 Encounter for prophylactic rabies immune globin: Secondary | ICD-10-CM | POA: Diagnosis not present

## 2023-10-17 MED ORDER — RABIES VACCINE, PCEC IM SUSR
1.0000 mL | Freq: Once | INTRAMUSCULAR | Status: AC
Start: 2023-10-17 — End: 2023-10-17
  Administered 2023-10-17: 1 mL via INTRAMUSCULAR
  Filled 2023-10-17: qty 1

## 2023-10-17 MED ORDER — RABIES IMMUNE GLOBULIN 150 UNIT/ML IM INJ
20.0000 [IU]/kg | INJECTION | Freq: Once | INTRAMUSCULAR | Status: AC
  Administered 2023-10-17: 1125 [IU] via INTRAMUSCULAR
  Filled 2023-10-17: qty 8

## 2023-10-17 NOTE — Discharge Instructions (Signed)
Please be sure to keep your wound clean, dry.    Monitor your condition carefully and do not hesitate to return here.  Otherwise, your rabies vaccine schedule is included and it is important to adhere to this.  The vaccine can be administered at most urgent cares or emergency departments.  Be sure to continue the Augmentin previously provided by your doctor.

## 2023-10-17 NOTE — ED Provider Notes (Signed)
[place] EMERGENCY DEPARTMENT AT [facility] Provider Note   CSN: [id number] Arrival date & time: 10/17/23  1504     History  Chief Complaint  Patient presents with   Animal Bite    Wendy Morse is a 67 y.o. female.  HPI Patient presents with concern of a wound she sustained yesterday.  The wound is in the posterior of her right lower leg.  She notes that she was bitten or scratched by a neighborhood dog.  She did find out that the dog had its last rabies shot in 2021.  Patient notes that her tetanus is up-to-date.  Yesterday she spoke with her physician, had the wound cleaned, has started Augmentin.  Today she presents with concern for the wound and possible rabies exposure. No other interval complaints, concerns.  Patient notes that she is generally well, was so prior to the event.    Home Medications Prior to Admission medications   Medication Sig Start Date End Date Taking? Authorizing Provider  acetaminophen (TYLENOL) 325 MG tablet Take 650 mg by mouth as needed.    [provider]  ALPRAZOLAM XR 0.5 MG 24 hr tablet  02/06/17   [provider]  atorvastatin (LIPITOR) 10 MG tablet Take 10 mg by mouth daily. 06/05/20   [provider]  Calcium Carb-Cholecalciferol (CALCIUM 600 + D PO) Take by mouth daily.    [provider]  carbamazepine (TEGRETOL) 200 MG tablet 1 pill each bedtime for 1 week, then 1 pill twice daily thereafter. 06/01/19   Huston Foley, MD  diclofenac sodium (VOLTAREN) 1 % GEL Apply 2 g topically 4 (four) times daily. 10/02/12   Reino Bellis R, DO  MILK THISTLE PO Take by mouth daily.    [provider]  Multiple Vitamins-Minerals (MULTIVITAMIN GUMMIES ADULT PO) Take by mouth daily.    [provider]  Omega-3 Fatty Acids (FISH OIL) 1000 MG CAPS Take by mouth daily.    [provider]  OVER THE COUNTER MEDICATION CBD oil    [provider]  QC ASPIRIN LOW DOSE 81 MG  chewable tablet Chew by mouth. 04/14/20   [provider]  traMADol (ULTRAM) 50 MG tablet Take 1 tablet (50 mg total) by mouth every 6 (six) hours as needed. 08/28/23   Enid Baas, MD  VIVELLE-DOT 0.025 MG/24HR Place 0.025 patches onto the skin 2 (two) times a week. 02/13/12   [provider]      Allergies    Other, Sulfa antibiotics, Bee venom, Ciprofloxacin, Dilantin [phenytoin], Latex, and Penicillins    Review of Systems   Review of Systems  Physical Exam Updated Vital Signs BP 101/65   Pulse (!) 59   Temp 98.3 F (36.8 C)   Resp 18   SpO2 100%  Physical Exam Vitals and nursing note reviewed.  Constitutional:      General: She is not in acute distress.    Appearance: She is well-developed.  HENT:     Head: Normocephalic and atraumatic.  Eyes:     Conjunctiva/sclera: Conjunctivae normal.  Cardiovascular:     Rate and Rhythm: Normal rate and regular rhythm.     Pulses: Normal pulses.  Pulmonary:     Effort: Pulmonary effort is normal. No respiratory distress.     Breath sounds: No stridor.  Abdominal:     General: There is no distension.  Skin:    General: Skin is warm and dry.       Neurological:  Mental Status: She is alert and oriented to person, place, and time.     Cranial Nerves: No cranial nerve deficit.  Psychiatric:        Mood and Affect: Mood normal.     ED Results / Procedures / Treatments   Labs (all labs ordered are listed, but only abnormal results are displayed) Labs Reviewed - No data to display  EKG None  Radiology No results found.  Procedures Procedures    Medications Ordered in ED Medications  rabies immune globulin (HYPERRAB/KEDRAB) injection 20 Units/kg (has no administration in time range)  rabies vaccine (RABAVERT) injection 1 mL (has no administration in time range)    ED Course/ Medical Decision Making/ A&P                                 Medical Decision Making Adult female presents after  sustaining a dog bite yesterday.  She has already had local wound care, initiation of Augmentin, both appropriate.  Tetanus is up-to-date.  She has no systemic complaints, no evidence for systemic illness.  With unclear rabies status of the dog, after lengthy conversation on risks and benefits, patient received initial rabies intervention here, will follow-up here or at affiliated facilities for ongoing rabies vaccination.  Risk Prescription drug management.  Final Clinical Impression(s) / ED Diagnoses Final diagnoses:  Dog bite, initial encounter    Rx / DC Orders ED Discharge Orders     None         Gerhard Munch, MD 10/17/23 1629

## 2023-10-17 NOTE — ED Triage Notes (Signed)
Pt here from home with c/o dog bite to the right calf that happened yesterday

## 2023-10-21 LAB — HELIX MOLECULAR SCREEN: Genetic Analysis Overall Interpretation: NEGATIVE

## 2023-10-21 LAB — GENECONNECT MOLECULAR SCREEN

## 2023-12-13 ENCOUNTER — Other Ambulatory Visit: Payer: Self-pay | Admitting: Sports Medicine

## 2024-09-10 ENCOUNTER — Encounter (INDEPENDENT_AMBULATORY_CARE_PROVIDER_SITE_OTHER): Payer: Self-pay

## 2024-10-13 ENCOUNTER — Other Ambulatory Visit: Payer: Self-pay

## 2024-10-13 ENCOUNTER — Ambulatory Visit (INDEPENDENT_AMBULATORY_CARE_PROVIDER_SITE_OTHER): Admitting: Family Medicine

## 2024-10-13 VITALS — BP 96/69 | Ht 66.0 in | Wt 131.0 lb

## 2024-10-13 DIAGNOSIS — M25551 Pain in right hip: Secondary | ICD-10-CM

## 2024-10-13 DIAGNOSIS — M25522 Pain in left elbow: Secondary | ICD-10-CM | POA: Diagnosis not present

## 2024-10-13 MED ORDER — NITROGLYCERIN 0.2 MG/HR TD PT24
MEDICATED_PATCH | TRANSDERMAL | 1 refills | Status: AC
Start: 2024-10-13 — End: ?

## 2024-10-13 NOTE — Patient Instructions (Addendum)
  VISIT SUMMARY: Today, you were seen for persistent pain in your left elbow and right hip. We discussed your symptoms, their impact on your daily activities, and potential treatment options.  YOUR PLAN: -LEFT LATERAL EPICONDYLITIS (TENNIS ELBOW): This condition involves inflammation of the tendons on the outer part of your elbow, causing pain and difficulty with gripping objects. You should continue using Voltaren  gel up to four times daily and start using nitroglycerin patches, cut into four pieces, changing the location daily. If your symptoms persist, we may consider shockwave therapy or platelet-rich plasma (PRP) therapy.   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 Do home exercises and stretches daily.  Icing 15 minutes at a time as needed.  Let physical therapy know about this diagnosis based on the ultrasound.   Follow up in 6 weeks for both issues but call sooner if you're struggling.

## 2024-10-14 ENCOUNTER — Encounter: Payer: Self-pay | Admitting: Family Medicine

## 2024-10-14 NOTE — Progress Notes (Signed)
 PCP: Bradford Geni Jansky, MD  Discussed the use of AI scribe software for clinical note transcription with the patient, who gave verbal consent to proceed.  History of Present Illness Wendy Morse is a 68 year old female who presents with persistent left elbow pain  Lateral elbow pain - Persistent, intense musculoskeletal pain localized to the left lateral elbow - Pain radiates down the arm - Aching quality throughout the arm - Increased severity and persistence compared to previous tendinitis episodes - Pain severe enough to disrupt sleep - Slight decrease in pain intensity since scheduling the appointment  Functional impairment - Difficulty gripping objects - Pain with finger abduction, wrist extension, and forearm supination  Activity association - Right-handed - Participates in rowing and bicycling, which do not exacerbate symptoms  Symptom management and medication intolerance - Applies Voltaren  gel twice daily - Uses ice for symptomatic relief - Unable to take NSAIDs due to previous issues with blood thinners  She also reported pain anterolateral right thigh with acute nature that started when getting up from a chair. No swelling or bruising but feels weak when trying to do this now.  History reviewed. No pertinent past medical history.  Current Outpatient Medications on File Prior to Visit  Medication Sig Dispense Refill   acetaminophen  (TYLENOL ) 325 MG tablet Take 650 mg by mouth as needed.     ALPRAZOLAM XR 0.5 MG 24 hr tablet      atorvastatin (LIPITOR) 10 MG tablet Take 10 mg by mouth daily.     Calcium Carb-Cholecalciferol (CALCIUM 600 + D PO) Take by mouth daily.     carbamazepine  (TEGRETOL ) 200 MG tablet 1 pill each bedtime for 1 week, then 1 pill twice daily thereafter. 60 tablet 3   diclofenac  sodium (VOLTAREN ) 1 % GEL Apply 2 g topically 4 (four) times daily. 1 Tube 2   MILK THISTLE PO Take by mouth daily.     Multiple Vitamins-Minerals  (MULTIVITAMIN GUMMIES ADULT PO) Take by mouth daily.     Omega-3 Fatty Acids (FISH OIL) 1000 MG CAPS Take by mouth daily.     OVER THE COUNTER MEDICATION CBD oil     QC ASPIRIN LOW DOSE 81 MG chewable tablet Chew by mouth.     traMADol  (ULTRAM ) 50 MG tablet Take 1 tablet (50 mg total) by mouth every 6 (six) hours as needed. 50 tablet 2   VIVELLE-DOT 0.025 MG/24HR Place 0.025 patches onto the skin 2 (two) times a week.     No current facility-administered medications on file prior to visit.    Past Surgical History:  Procedure Laterality Date   APPENDECTOMY      Allergies  Allergen Reactions   Other Anaphylaxis   Sulfa Antibiotics Other (See Comments)    AS A CHILD   Bee Venom    Ciprofloxacin    Dilantin [Phenytoin]    Latex    Penicillins     BP 96/69   Ht 5' 6 (1.676 m)   Wt 131 lb (59.4 kg)   BMI 21.14 kg/m       No data to display              No data to display              Objective:  Physical Exam:  Gen: NAD, comfortable in exam room  Left elbow: No deformity, swelling, bruising. FROM with 5/5 strength.  Pain with resisted wrist and 3rd digit extension. Tenderness to palpation lateral epicondyle.  No other tenderness. Collateral ligaments intact NVI distally.  Right hip: No swelling, bruising, deformity. Strength 5/5 knee extension and hip flexion but pain with knee extension. Tenderness proximolateral right hip anteriorly.  Also tender over IT band proximally.  Limited MSK u/s left elbow:  Partial tear common extensor tendon, lateral epicondylitis.    Limited MSK u/r right thigh: Partial tear of vastus lateralis proximally at origin Assessment & Plan Left lateral epicondylitis (tennis elbow) Left lateral epicondylitis with radiating pain, affecting grip strength, and causing nocturnal discomfort. Likely due to inflammation of the common extensor tendon at the lateral epicondyle. NSAIDs contraindicated due to blood thinners. - Continue  Voltaren  gel up to four times daily. - Apply nitroglycerin patches, cut into four pieces, changing location daily. - Consider PRP if symptoms persist and other treatments fail. - Performed ultrasound to assess the tendon which confirmed diagnosis.  Right hip pain with partial tear vastus lateralis proximally and IT band syndrome Right hip pain with difficulty weight-bearing and radiating pain.  - Perform ultrasound to evaluate for iliotibial band syndrome or rectus femoris injury. - Continue current stretching and strengthening exercises for IT band and hip flexors.
